# Patient Record
Sex: Male | Born: 1935 | Race: White | Hispanic: No | Marital: Married | Smoking: Never smoker
Health system: Southern US, Community
[De-identification: ages and names within clinical notes are randomized; demographics above are authoritative.]

## PROBLEM LIST (undated history)

## (undated) DIAGNOSIS — C189 Malignant neoplasm of colon, unspecified: Secondary | ICD-10-CM

## (undated) DIAGNOSIS — I10 Essential (primary) hypertension: Secondary | ICD-10-CM

## (undated) DIAGNOSIS — R197 Diarrhea, unspecified: Secondary | ICD-10-CM

## (undated) DIAGNOSIS — I2510 Atherosclerotic heart disease of native coronary artery without angina pectoris: Secondary | ICD-10-CM

## (undated) DIAGNOSIS — K219 Gastro-esophageal reflux disease without esophagitis: Secondary | ICD-10-CM

## (undated) DIAGNOSIS — S90414A Abrasion, right lesser toe(s), initial encounter: Secondary | ICD-10-CM

## (undated) DIAGNOSIS — S80811A Abrasion, right lower leg, initial encounter: Secondary | ICD-10-CM

## (undated) DIAGNOSIS — K589 Irritable bowel syndrome without diarrhea: Secondary | ICD-10-CM

## (undated) DIAGNOSIS — I639 Cerebral infarction, unspecified: Secondary | ICD-10-CM

## (undated) DIAGNOSIS — S90411A Abrasion, right great toe, initial encounter: Secondary | ICD-10-CM

## (undated) DIAGNOSIS — E039 Hypothyroidism, unspecified: Secondary | ICD-10-CM

## (undated) DIAGNOSIS — C20 Malignant neoplasm of rectum: Secondary | ICD-10-CM

## (undated) HISTORY — PX: KNEE SURGERY: SHX244

## (undated) HISTORY — DX: Malignant neoplasm of rectum: C20

## (undated) HISTORY — DX: Gastro-esophageal reflux disease without esophagitis: K21.9

## (undated) HISTORY — DX: Cerebral infarction, unspecified: I63.9

## (undated) HISTORY — DX: Atherosclerotic heart disease of native coronary artery without angina pectoris: I25.10

## (undated) HISTORY — DX: Essential (primary) hypertension: I10

---

## 2012-10-11 ENCOUNTER — Ambulatory Visit: Payer: Self-pay | Admitting: Physician Assistant

## 2012-10-15 ENCOUNTER — Ambulatory Visit (INDEPENDENT_AMBULATORY_CARE_PROVIDER_SITE_OTHER): Payer: Medicare Other | Admitting: Physician Assistant

## 2012-10-15 ENCOUNTER — Encounter: Payer: Self-pay | Admitting: Physician Assistant

## 2012-10-15 ENCOUNTER — Other Ambulatory Visit (INDEPENDENT_AMBULATORY_CARE_PROVIDER_SITE_OTHER): Payer: Medicare Other

## 2012-10-15 ENCOUNTER — Other Ambulatory Visit: Payer: Self-pay | Admitting: *Deleted

## 2012-10-15 VITALS — BP 110/60 | HR 76 | Ht 73.0 in | Wt 198.0 lb

## 2012-10-15 DIAGNOSIS — E785 Hyperlipidemia, unspecified: Secondary | ICD-10-CM | POA: Insufficient documentation

## 2012-10-15 DIAGNOSIS — I10 Essential (primary) hypertension: Secondary | ICD-10-CM | POA: Insufficient documentation

## 2012-10-15 DIAGNOSIS — R197 Diarrhea, unspecified: Secondary | ICD-10-CM

## 2012-10-15 DIAGNOSIS — R634 Abnormal weight loss: Secondary | ICD-10-CM

## 2012-10-15 DIAGNOSIS — I519 Heart disease, unspecified: Secondary | ICD-10-CM | POA: Insufficient documentation

## 2012-10-15 LAB — CBC
HCT: 39.9 % (ref 39.0–52.0)
Hemoglobin: 13.3 g/dL (ref 13.0–17.0)
MCV: 84.9 fl (ref 78.0–100.0)
RBC: 4.7 Mil/uL (ref 4.22–5.81)
RDW: 14.6 % (ref 11.5–14.6)
WBC: 8.8 10*3/uL (ref 4.5–10.5)

## 2012-10-15 LAB — C-REACTIVE PROTEIN: CRP: 0.6 mg/dL (ref 0.5–20.0)

## 2012-10-15 MED ORDER — MOVIPREP 100 G PO SOLR: 1.0000 | Freq: Once | ORAL | Status: DC

## 2012-10-15 NOTE — Progress Notes (Signed)
Subjective:    Patient ID: Alexander Johnston, male    DOB: 11-Feb-1925, 79 y.o.   MRN: [id number]  HPI [name] is a pleasant 79 year old white male, and new to GI today, self-referred for evaluation of diarrhea. Patient states that he has history of hypertension and coronary artery disease that no prior MI. He also has hyperlipidemia. He states that he has had problems with diarrhea over the past 4 years. He had undergone an evaluation through digestive health in [place] with Dr. Jobie Quaker within the past year and states that he was given a medication that he took for about 12 weeks that he says initially seen to help quite a bit but then lost its effectiveness. Neither he nor her's daughter remember the name of this medication. He has had some lab work done as well but did not have colonoscopy He was to be scheduled for colonoscopy however patient felt that the prep was excessively long over 4 days and declined to go through with the procedure. Patient has not had any prior colonoscopy, family history is negative for GI diseases far as he is aware. He says he is been taking probiotic so the past couple of months and seems to think they are helping. He says he has on average 8-10 bowel movements per day of loose to liquid stool. These episodes are associated with urgency that is worse postprandially. He also has frequent nocturnal episodes of diarrhea. He has in the past had incontinence . He has lost 50 pounds gradually over the past 2 years and says that his appetite is good but he finds he has less diarrhea PEs last. No nausea or vomiting no fever or chills no abdominal cramping or pain. He does admit to excessive gas. He very occasionally will have a normal bowel movement. He has had periods of time where he has seen mucus and small amounts of blood. We .do not have records from his GI evaluation or his primary care provider at the time of this office visit   Review of Systems  Constitutional: Positive for  unexpected weight change.  HENT: Negative.   Eyes: Negative.   Respiratory: Negative.   Cardiovascular: Negative.   Gastrointestinal: Positive for diarrhea.  Endocrine: Negative.   Genitourinary: Negative.   Musculoskeletal: Negative.   Skin: Negative.   Allergic/Immunologic: Negative.   Neurological: Negative.   Hematological: Negative.   Psychiatric/Behavioral: Negative.    Outpatient Encounter Prescriptions as of 10/15/2012  Medication Sig Dispense Refill  . amLODipine (NORVASC) 10 MG tablet Take 10 mg by mouth daily.      Marland Kitchen diltiazem (CARDIZEM CD) 240 MG 24 hr capsule Take 240 mg by mouth daily.      . hydrochlorothiazide (HYDRODIURIL) 25 MG tablet Take 25 mg by mouth daily.      . Levothyroxine Sodium (SYNTHROID PO) Take 1 tablet by mouth daily.      Marland Kitchen losartan (COZAAR) 100 MG tablet Take 100 mg by mouth daily.      . Rosuvastatin Calcium (CRESTOR PO) Take 1 tablet by mouth daily.      . sertraline (ZOLOFT) 25 MG tablet Take 25 mg by mouth daily.       No facility-administered encounter medications on file as of 10/15/2012.   No Known Allergies Patient Active Problem List   Diagnosis Date Noted  . HTN (hypertension) 10/15/2012  . Other and unspecified hyperlipidemia 10/15/2012  . Heart disease, unspecified 10/15/2012   History  Substance Use Topics  . Smoking status: Never  Smoker   . Smokeless tobacco: Never Used  . Alcohol Use: No   family history includes Diabetes in his brother; Heart disease in his mother; Hypertension in his brother.      Objective:   Physical Exam well-developed elderly white male in no acute distress, accompanied by his daughter blood pressure 110/60 pulse 76 height 6 foot 1 weight 198. HEENT; nontraumatic normocephalic EOMI PERRLA sclera anicteric, Supple; no JVD, Cardiovascular; regular rate and rhythm with S1-S2, Pulmonary clear bilaterally, Abdomen; soft, bowel sounds are somewhat hyperactive there is no palpable mass or hepatosplenomegaly he  is nontender, Rectal ;exam not done patient and daughter reports stool is documented Hemoccult positive, Extremities; 1+ edema in the ankles, Psych; mood and affect normal and appropriate he is somewhat hard of hearing        Assessment & Plan:  #49  79 year old male with 4 year history of persistent diarrhea with urgency and occasional incontinence and and associated 50 pound weight loss. Will need to rule out an underlying colitis or microscopic colitis or other malabsorptive process appear #2 hypertension #3 hyperlipidemia #4 coronary disease  Plan; will obtain records from his previous GI evaluation in his primary care provider Continue probiotics on a daily basis Check CBC and CRP today Schedule for colonoscopy with Dr. Arlyce Dice with random biopsies of the colon. Procedure was discussed in detail with the patient and his.her and they are agreeable to proceed He will continue to use Imodium when necessary

## 2012-10-15 NOTE — Patient Instructions (Addendum)
Please go to the basement level to have your labs drawn.  You have been scheduled for a colonoscopy with propofol. Please follow written instructions given to you at your visit today.  Please pick up your prep kit at the pharmacy within the next 1-3 days. If you use inhalers (even only as needed), please bring them with you on the day of your procedure.

## 2012-10-16 NOTE — Progress Notes (Signed)
Reviewed and agree with management.  Would also check a CMET and hemeoccults Marionna Gonia D. Arlyce Dice, M.D., [facility]

## 2012-10-17 ENCOUNTER — Ambulatory Visit (AMBULATORY_SURGERY_CENTER): Payer: Medicare Other | Admitting: Gastroenterology

## 2012-10-17 ENCOUNTER — Other Ambulatory Visit: Payer: Self-pay | Admitting: Gastroenterology

## 2012-10-17 ENCOUNTER — Other Ambulatory Visit (INDEPENDENT_AMBULATORY_CARE_PROVIDER_SITE_OTHER): Payer: Medicare Other

## 2012-10-17 ENCOUNTER — Telehealth: Payer: Self-pay

## 2012-10-17 ENCOUNTER — Encounter: Payer: Self-pay | Admitting: Gastroenterology

## 2012-10-17 VITALS — BP 112/71 | HR 67 | Temp 97.4°F | Resp 10 | Ht 73.0 in | Wt 198.0 lb

## 2012-10-17 DIAGNOSIS — L814 Other melanin hyperpigmentation: Secondary | ICD-10-CM

## 2012-10-17 DIAGNOSIS — C19 Malignant neoplasm of rectosigmoid junction: Secondary | ICD-10-CM

## 2012-10-17 DIAGNOSIS — D6859 Other primary thrombophilia: Secondary | ICD-10-CM

## 2012-10-17 DIAGNOSIS — D126 Benign neoplasm of colon, unspecified: Secondary | ICD-10-CM

## 2012-10-17 DIAGNOSIS — R197 Diarrhea, unspecified: Secondary | ICD-10-CM

## 2012-10-17 DIAGNOSIS — K6289 Other specified diseases of anus and rectum: Secondary | ICD-10-CM

## 2012-10-17 DIAGNOSIS — C189 Malignant neoplasm of colon, unspecified: Secondary | ICD-10-CM

## 2012-10-17 DIAGNOSIS — K5289 Other specified noninfective gastroenteritis and colitis: Secondary | ICD-10-CM

## 2012-10-17 DIAGNOSIS — C187 Malignant neoplasm of sigmoid colon: Secondary | ICD-10-CM

## 2012-10-17 DIAGNOSIS — K5730 Diverticulosis of large intestine without perforation or abscess without bleeding: Secondary | ICD-10-CM

## 2012-10-17 HISTORY — PX: COLONOSCOPY W/ BIOPSIES: SHX1374

## 2012-10-17 HISTORY — DX: Malignant neoplasm of colon, unspecified: C18.9

## 2012-10-17 LAB — COMPREHENSIVE METABOLIC PANEL
CO2: 28 mEq/L (ref 19–32)
Calcium: 8.4 mg/dL (ref 8.4–10.5)
Creatinine, Ser: 0.7 mg/dL (ref 0.4–1.5)
GFR: 115.59 mL/min (ref >60.00)
Glucose, Bld: 91 mg/dL (ref 70–99)
Total Bilirubin: 0.8 mg/dL (ref 0.3–1.2)
Total Protein: 6 g/dL (ref 6.0–8.3)

## 2012-10-17 LAB — PROTIME-INR: INR: 1.2 ratio — ABNORMAL HIGH (ref 0.8–1.0)

## 2012-10-17 MED ORDER — SODIUM CHLORIDE 0.9 % IV SOLN: 500.0000 mL | INTRAVENOUS | Status: DC

## 2012-10-17 NOTE — Progress Notes (Signed)
Patient did not experience any of the following events: a burn prior to discharge; a fall within the facility; wrong site/side/patient/procedure/implant event; or a hospital transfer or hospital admission upon discharge from the facility. (G8907) Patient did not have preoperative order for IV antibiotic SSI prophylaxis. (G8918)  

## 2012-10-17 NOTE — Patient Instructions (Signed)
YOU HAD AN ENDOSCOPIC PROCEDURE TODAY AT THE [facility]: Refer to the procedure report that was given to you for any specific questions about what was found during the examination.  If the procedure report does not answer your questions, please call your gastroenterologist to clarify.  If you requested that your care partner not be given the details of your procedure findings, then the procedure report has been included in a sealed envelope for you to review at your convenience later.  YOU SHOULD EXPECT: Some feelings of bloating in the abdomen. Passage of more gas than usual.  Walking can help get rid of the air that was put into your GI tract during the procedure and reduce the bloating. If you had a lower endoscopy (such as a colonoscopy or flexible sigmoidoscopy) you may notice spotting of blood in your stool or on the toilet paper. If you underwent a bowel prep for your procedure, then you may not have a normal bowel movement for a few days.  DIET: Your first meal following the procedure should be a light meal and then it is ok to progress to your normal diet.  A half-sandwich or bowl of soup is an example of a good first meal.  Heavy or fried foods are harder to digest and may make you feel nauseous or bloated.  Likewise meals heavy in dairy and vegetables can cause extra gas to form and this can also increase the bloating.  Drink plenty of fluids but you should avoid alcoholic beverages for 24 hours.  ACTIVITY: Your care partner should take you home directly after the procedure.  You should plan to take it easy, moving slowly for the rest of the day.  You can resume normal activity the day after the procedure however you should NOT DRIVE or use heavy machinery for 24 hours (because of the sedation medicines used during the test).    SYMPTOMS TO REPORT IMMEDIATELY: A gastroenterologist can be reached at any hour.  During normal business hours, 8:30 AM to 5:00 PM Monday through Friday,  call [phone].  After hours and on weekends, please call the GI answering service at [phone] who will take a message and have the physician on call contact you.   Following lower endoscopy (colonoscopy or flexible sigmoidoscopy):  Excessive amounts of blood in the stool  Significant tenderness or worsening of abdominal pains  Swelling of the abdomen that is new, acute  Fever of 100F or higher    FOLLOW UP: If any biopsies were taken you will be contacted by phone or by letter within the next 1-3 weeks.  Call your gastroenterologist if you have not heard about the biopsies in 3 weeks.  Our staff will call the home number listed on your records the next business day following your procedure to check on you and address any questions or concerns that you may have at that time regarding the information given to you following your procedure. This is a courtesy call and so if there is no answer at the home number and we have not heard from you through the emergency physician on call, we will assume that you have returned to your regular daily activities without incident.  SIGNATURES/CONFIDENTIALITY: You and/or your care partner have signed paperwork which will be entered into your electronic medical record.  These signatures attest to the fact that that the information above on your After Visit Summary has been reviewed and is understood.  Full responsibility of the confidentiality   of this discharge information lies with you and/or your care-partner.   You will be assisted to lab for labwork ordered by Dr Arlyce Dice . Today before you leave.  Dr. Marzetta Board office will set up a CT scan of abd pelvis & chest.  Contrast is given to you with an instruction sheet to fill out times to take once CT details done

## 2012-10-17 NOTE — Progress Notes (Signed)
Called to room to assist during endoscopic procedure.  Patient ID and intended procedure confirmed with present staff. Received instructions for my participation in the procedure from the performing physician. ewm 

## 2012-10-17 NOTE — Progress Notes (Signed)
Patient states that his wife has Alzheimers and knows him sometimes.  He's been very depressed, and he's not eating well.  His other Dr. Prentiss Bells him on Zoloft for depression.  The patient is very sad about his wife.   Patient does have a depends on due to the fact that there was stool all over his apartment according to his daughter.   The patient is a very proud man, and doesn't want anyone to help him.

## 2012-10-17 NOTE — Progress Notes (Signed)
Lidocaine-40mg IV prior to Propofol InductionPropofol given over incremental dosages 

## 2012-10-17 NOTE — Telephone Encounter (Signed)
Pt scheduled for CT of CAP at [facility] CT 10/21/12@2pm . Pt to be NPO after 10am, drink bottle 1 of contrast at 12noon and bottle 2 of contrast at 1pm. Spoke with Boyd Kerbs RN in [facility] she will notify pt of appt date and instructions. Pt to have labs before leaving today, orders in epic.

## 2012-10-17 NOTE — Op Note (Signed)
[facility] [address] [place], [address]   COLONOSCOPY PROCEDURE REPORT  PATIENT: Nichalas, Coin  MR#: [id number] BIRTHDATE: [birth year] , 87  yrs. old GENDER: Male ENDOSCOPIST: Louis Meckel, MD REFERRED QI:ONGEX Tripp, M.D. PROCEDURE DATE:  10/17/2012 PROCEDURE:   Colonoscopy with cold biopsy polypectomy, Colonoscopy with snare polypectomy, Colonoscopy with biopsy, and Submucosal injection, any substance ASA CLASS:   Class II INDICATIONS:chronic diarrhea. MEDICATIONS: MAC sedation, administered by CRNA and propofol (Diprivan) 300mg  IV  DESCRIPTION OF PROCEDURE:   After the risks benefits and alternatives of the procedure were thoroughly explained, informed consent was obtained.  A digital rectal exam revealed a palpable rectal mass. 3-4cm from the anal verge.  The [id number] endoscope was introduced through the anus and advanced to the cecum, which was identified by the ileocecal valve. No adverse events experienced.   Limited by poor preparation. There was a significant amount of retained solid and liquid stool  The quality of the prep was Suprep poor  The instrument was then slowly withdrawn as the colon was fully examined.      COLON FINDINGS: There was a circumferential mass beginning at approximately 3-4 cm from the anal verge and extending at least 10-12 cm proximally.  The lumen was narrowed but the 12 mm colonoscope could easily traverse the strictured area.  Because was biopsied in multiple biopsies were taken.  The proximal and distal ends of the mass were marked with submucosal injections of spot. In the distal descending colon there was a semi-productive at 2 cm polyp with markedly friable mucosa.  Biopsies were taken and the area was marked with submucosal injections of spot. There is diffuse melanosis coli. In the ascending colon there were 23 mm sessile polyps that were removed with cold polypectomy snare and submitted to  pathology. There was diffuse melanosis coli.  It was difficult to obtain a detailed examination of the mucosa because of poor prep.  There appeared to be some erythema throughout the colon and there was mild edema in the area of the sigmoid.  Biopsies were taken throughout the colon.   There was a circumferential mass beginning at approximately 3-4 cm from the anal verge and extending at least 10-12 cm proximally.  The lumen was narrowed but the 12 mm colonoscope could easily traverse the strictured area.  Because was biopsied in multiple biopsies were taken.  The proximal and distal ends of the mass were marked with submucosal injections of spot. In the distal descending colon there was a semi-productive at 2 cm polyp with markedly friable mucosa.  Biopsies were taken and the area was marked with submucosal injections of spot. .  Retroflexion was not performed due to a narrow rectal vault. The time to cecum=13 minutes 45 seconds.  Withdrawal time=26 minutes 35 seconds.  The scope was withdrawn and the procedure completed. COMPLICATIONS: There were no complications.  ENDOSCOPIC IMPRESSION: 1.   malignant neoplasm of the rectosigmoid 2.  large polyp distal descending colon-rule out synchronous lesion 3.  possible colitis 4.  benign polyps-ascending colon 5.  melanosis coli 6.  diverticulosis  RECOMMENDATIONS: 1.  await biopsy findings 2.  CT of the chest, abdomen and pelvis 3.  colonoscopy one year  eSigned:  Louis Meckel, MD 10/17/2012 3:21 PM   cc: Almond Lint, MD   PATIENT NAME:  Quasean, Frye MR#: [id number]

## 2012-10-17 NOTE — Progress Notes (Signed)
Per Dr. Arlyce Dice, pt is to have labwork today before discharge- CMET, CEA, and INR.  Selinda Michaels, RN aware and will put order into EPIC

## 2012-10-18 ENCOUNTER — Telehealth: Payer: Self-pay

## 2012-10-18 LAB — CEA: CEA: 17.3 ng/mL — ABNORMAL HIGH (ref 0.0–5.0)

## 2012-10-18 NOTE — Telephone Encounter (Signed)
  Follow up Call-  Call back number 10/17/2012  Post procedure Call Back phone  # [phone] Celeste  Permission to leave phone message Yes     Patient questions:  Do you have a fever, pain , or abdominal swelling? no Pain Score  0 *  Have you tolerated food without any problems? yes  Have you been able to return to your normal activities? yes  Do you have any questions about your discharge instructions: Diet   no Medications  no Follow up visit  no  Do you have questions or concerns about your Care? no  Actions: * If pain score is 4 or above: No action needed, pain <4.

## 2012-10-21 ENCOUNTER — Telehealth: Payer: Self-pay | Admitting: Gastroenterology

## 2012-10-21 ENCOUNTER — Ambulatory Visit (INDEPENDENT_AMBULATORY_CARE_PROVIDER_SITE_OTHER)
Admission: RE | Admit: 2012-10-21 | Discharge: 2012-10-21 | Disposition: A | Discharge status: Home / Self Care | Payer: Medicare Other | Source: Ambulatory Visit | Attending: Gastroenterology | Admitting: Gastroenterology

## 2012-10-21 ENCOUNTER — Ambulatory Visit (INDEPENDENT_AMBULATORY_CARE_PROVIDER_SITE_OTHER): Payer: Medicare Other | Admitting: General Surgery

## 2012-10-21 ENCOUNTER — Encounter (INDEPENDENT_AMBULATORY_CARE_PROVIDER_SITE_OTHER): Payer: Self-pay | Admitting: General Surgery

## 2012-10-21 ENCOUNTER — Telehealth: Payer: Self-pay | Admitting: *Deleted

## 2012-10-21 VITALS — BP 102/58 | HR 68 | Temp 98.1°F | Resp 14 | Ht 74.0 in | Wt 200.6 lb

## 2012-10-21 DIAGNOSIS — C20 Malignant neoplasm of rectum: Secondary | ICD-10-CM

## 2012-10-21 DIAGNOSIS — C19 Malignant neoplasm of rectosigmoid junction: Secondary | ICD-10-CM

## 2012-10-21 DIAGNOSIS — R198 Other specified symptoms and signs involving the digestive system and abdomen: Secondary | ICD-10-CM

## 2012-10-21 DIAGNOSIS — K6289 Other specified diseases of anus and rectum: Secondary | ICD-10-CM

## 2012-10-21 MED ORDER — IOHEXOL 300 MG/ML  SOLN
100.0000 mL | Freq: Once | INTRAMUSCULAR | Status: AC | PRN
  Administered 2012-10-21: 100 mL via INTRAVENOUS

## 2012-10-21 NOTE — Assessment & Plan Note (Signed)
Pt has low rectal cancer starting at 3-4 cm and going 12 cm up.    Pt having Ct scan today.  Given size of tumor and length of duration of symptoms, pt is likely to have lymphatic disease and possibly metastatic disease.  If patient has metastatic disease, we would proceed with systemic treatment.  If ct negative for mets, but positive for lymphatic disease, we would proceed with chemoradiation.    If negative for both, I would obtain EUS to see if T3.    I suspect that if he has complete response to treatment, he would be a reasonable candidate to follow.  He has patulous sphincter and already has issues with incontinence.  I think he would do poorly with low coloanal anastamosis.  He has good functional status other than his bowel function, however, and would be candidate for APR.  We will discuss at conference and see the results of his scan.    30 min spent in counseling.

## 2012-10-21 NOTE — Progress Notes (Signed)
Chief Complaint  Patient presents with  . New Evaluation    eval rectal cancer    HISTORY: Pt is a 79 yo M who presents with diarrhea and blood in stool.  He has had diarrhea for around 4 years.  He started having more incontinence recently.  He keeps paper towels in his underwear because of soilage.  He denies anal pain.  He denies frank blood in toilet, but the soilage he has is usually pink.  He has had around a 50 pound weight loss, but has deliberately tried to lose weight.  His daughter says he seems to avoiding eating altogether because it makes him have more diarrhea.  He has not had any prior colonoscopies before this recent one that Dr. Arlyce Dice performed.  He was found to have circumferential mass 3-4 cm from anal verge extending 10-12 cm proximally.  The proximal and distal extent of tumor was tattooed.  The mass was partially obstruction, but 12 mm scope was able to pass.  Retroflexion could not be performed due to non compliant rectum.    Past Medical History  Diagnosis Date  . CAD (coronary artery disease)   . Hypertension   . Stroke   . GERD (gastroesophageal reflux disease)     Past Surgical History  Procedure Laterality Date  . Knee surgery      bilateral    Current Outpatient Prescriptions  Medication Sig Dispense Refill  . amLODipine (NORVASC) 10 MG tablet Take 10 mg by mouth daily.      Marland Kitchen diltiazem (CARDIZEM CD) 240 MG 24 hr capsule Take 240 mg by mouth daily.      . hydrochlorothiazide (HYDRODIURIL) 25 MG tablet Take 25 mg by mouth daily.      . Levothyroxine Sodium (SYNTHROID PO) Take 1 tablet by mouth daily.      Marland Kitchen losartan (COZAAR) 100 MG tablet Take 100 mg by mouth daily.      . Rosuvastatin Calcium (CRESTOR PO) Take 1 tablet by mouth daily.      . sertraline (ZOLOFT) 25 MG tablet Take 25 mg by mouth daily.       No current facility-administered medications for this visit.     No Known Allergies   Family History  Problem Relation Age of Onset  . Heart  disease Mother     "all her family"  . Diabetes Brother   . Hypertension Brother   . Cancer Daughter     choriocarricinoma     History   Social History  . Marital Status: Married    Spouse Name: N/A    Number of Children: 5  . Years of Education: N/A   Social History Main Topics  . Smoking status: Never Smoker   . Smokeless tobacco: Never Used  . Alcohol Use: No  . Drug Use: No  . Sexual Activity: None    REVIEW OF SYSTEMS - PERTINENT POSITIVES ONLY: 12 point review of systems negative other than HPI and PMH except for weight loss, diarrhea, weakness, rectal bleeding.    EXAM: Filed Vitals:   10/21/12 0907  BP: 102/58  Pulse: 68  Temp: 98.1 F (36.7 C)  Resp: 14   Filed Weights   10/21/12 0907  Weight: 200 lb 9.6 oz (90.992 kg)     Gen:  No acute distress.  Well nourished and well groomed.   Neurological: Alert and oriented to person, place, and time. Coordination normal. Hard of hearing.   Head: Normocephalic and atraumatic.  Eyes: Conjunctivae  are normal. Pupils are equal, round, and reactive to light. No scleral icterus.  Neck: Normal range of motion. Neck supple. No tracheal deviation or thyromegaly present.  Cardiovascular: Normal rate, regular rhythm, normal heart sounds and intact distal pulses.  Exam reveals no gallop and no friction rub.  No murmur heard. Respiratory: Effort normal.  No respiratory distress. No chest wall tenderness. Breath sounds normal.  No wheezes, rales or rhonchi.  GI: Soft. Bowel sounds are normal. The abdomen is soft and nontender.  Non distended.  There is no rebound and no guarding.  Rectal exam:  Poor sphincter tone.  External hemorrhoids.  Mass palpable at tip of finger around 4 cm.  ? Fixed.   Musculoskeletal: Normal range of motion. Extremities are nontender.  Lymphadenopathy: No cervical, preauricular, postauricular or axillary adenopathy is present Skin: Skin is warm and dry. No rash noted. No diaphoresis. No erythema. No  pallor. No clubbing, cyanosis, or edema.   Psychiatric: Normal mood and affect. Behavior is normal. Judgment and thought content normal.    LABORATORY RESULTS: Available labs are reviewed  Pathology Diagnosis 1. Colon, biopsy, ascending - TUBULOVILLOUS ADENOMA. - HIGH GRADE DYSPLASIA IS NOT IDENTIFIED. 2. Colon, biopsy, random - MELANOSIS COLI. - THERE IS NO EVIDENCE OF IDIOPATHIC INFLAMMATORY BOWEL DISEASE, DYSPLASIA, OR MALIGNANCY. 3. Rectosigmoid , rectosigmoid mass - ADENOCARCINOMA. - SEE COMMENT. 4. Colon, biopsy, distal descending - TUBULOVILLOUS ADENOMA. - HIGH GRADE DYSPLASIA IS NOT IDENTIFIED.  CEA 17.3, K 3.0.  INR 1.2, otherwise, essentially normal labs.    RADIOLOGY RESULTS: See E-Chart or I-Site for most recent results.  Images and reports are reviewed. Scans pending.     ASSESSMENT AND PLAN: Rectal cancer Pt has low rectal cancer starting at 3-4 cm and going 12 cm up.    Pt having Ct scan today.  Given size of tumor and length of duration of symptoms, pt is likely to have lymphatic disease and possibly metastatic disease.  If patient has metastatic disease, we would proceed with systemic treatment.  If ct negative for mets, but positive for lymphatic disease, we would proceed with chemoradiation.    If negative for both, I would obtain EUS to see if T3.    I suspect that if he has complete response to treatment, he would be a reasonable candidate to follow.  He has patulous sphincter and already has issues with incontinence.  I think he would do poorly with low coloanal anastamosis.  He has good functional status other than his bowel function, however, and would be candidate for APR.  We will discuss at conference and see the results of his scan.    30 min spent in counseling.        Maudry Diego MD Surgical Oncology, General and Endocrine Surgery [facility]      Visit Diagnoses: 1. Rectal cancer     Primary Care  Physician: Florentina Jenny, MD  Barnet Pall MD GI

## 2012-10-21 NOTE — Patient Instructions (Signed)
Once we get the Ct scan result, this will determine which direction to go in.  We may need to get another test, an endoscopic ultrasound, in order to determine treatment.    You have an appt next week with Dr. Truett Perna.    If additional lesions are seen on the CT scan, you might not need that other test.  Many times patients with this type of tumor end up getting chemoradiation (chemo pill and radiation) prior to surgery.  We do not yet have enough information to make this decision.

## 2012-10-21 NOTE — Telephone Encounter (Signed)
Pt had CT at 2pm today. Discussed with pts daughter that the results were not back yet.

## 2012-10-21 NOTE — Telephone Encounter (Signed)
Spoke with patient's daughter by phone and confirmed appointment with Dr. Truett Perna for 10/31/12.  All questions were answered.  Contact names and numbers were provided.

## 2012-10-22 ENCOUNTER — Telehealth: Payer: Self-pay | Admitting: Gastroenterology

## 2012-10-22 NOTE — Telephone Encounter (Signed)
Left message for pts daughter to call back.  Spoke with pts daughter and she is aware. 

## 2012-10-22 NOTE — Telephone Encounter (Signed)
Pts daughter calling for CT results. Please advise.

## 2012-10-22 NOTE — Telephone Encounter (Signed)
Inform the patient that the CT scan looks very good.  Disease is confined to the colon.  There is no evidence for metastatic disease.

## 2012-10-31 ENCOUNTER — Telehealth: Payer: Self-pay

## 2012-10-31 ENCOUNTER — Ambulatory Visit: Payer: Medicare Other

## 2012-10-31 ENCOUNTER — Other Ambulatory Visit: Payer: Self-pay

## 2012-10-31 ENCOUNTER — Telehealth: Payer: Self-pay | Admitting: Oncology

## 2012-10-31 ENCOUNTER — Ambulatory Visit (HOSPITAL_BASED_OUTPATIENT_CLINIC_OR_DEPARTMENT_OTHER): Payer: Medicare Other | Admitting: Oncology

## 2012-10-31 ENCOUNTER — Other Ambulatory Visit: Payer: Self-pay | Admitting: *Deleted

## 2012-10-31 VITALS — BP 134/70 | HR 70 | Temp 98.6°F | Resp 20 | Ht 74.0 in | Wt 193.1 lb

## 2012-10-31 DIAGNOSIS — C20 Malignant neoplasm of rectum: Secondary | ICD-10-CM

## 2012-10-31 NOTE — Progress Notes (Signed)
[facility] New Patient Consult   Referring MD: Rob Kaplan   Alexander Johnston 79 y.o.  07/16/1924    Reason for Referral: Rectal cancer     HPI: He was referred to Dr. Kaplan for evaluation of "diarrhea ". He reports intermittent diarrhea for the past 4 years. He describes frequent small volume stools that include mucous and blood. He has rectal urgency.  He was taken to a colonoscopy on 10/17/2012. Rectal exam confirmed a mass at 3-4 cm from the anal verge. The preparation was poor. A circumferential mass was noted beginning at 3-4 cm from the anal verge and extending at least 10-12 cm proximally. Multiple biopsies were obtained. The mass was tattooed. A 2 cm polyp was noted in the descending colon. The polyp was biopsied and tattooed. Diffuse melanosis coli. Sessile polyps were removed from the a sending colon and submitted to pathology. Erythema was noted throughout the colon with mild edema in the sigmoid.  The pathology ([id number]) revealed a tubulovillous adenoma of the ascending colon, random colon biopsy with melanosis coli and no evidence of inflammatory bowel disease or malignancy. The rectosigmoid mass biopsy revealed adenocarcinoma. The descending colon biopsy revealed a tubulovillous adenoma without high-grade dysplasia.  CTs of the chest, abdomen, and pelvis on 10/21/2012 revealed no evidence of metastatic disease in the chest. No liver masses to diffuse rectal wall thickening was noted. Mild soft tissue stranding in the perirectal fat with no pathologically enlarged lymph nodes. No pelvic or abdominal lymphadenopathy. No other soft tissue masses. Large colonic stool burden and extensive diverticulosis.  He was referred to Dr. Byerly. She recommends an EUS to define the clinical stage prior to deciding on surgery versus neoadjuvant therapy.    Past Medical History  Diagnosis Date  . CAD (coronary artery disease)   . Hypertension   .  TIA   approximately  2 years ago   . GERD (gastroesophageal reflux disease)     .   Depression                                                                                              started an antidepressant approximately 2 weeks ago  Past Surgical History  Procedure Laterality Date  . Knee surgery      bilateral knee replacement    .   Removal of a "palate "tumor                                                                                                     50 years ago  Family History  Problem Relation Age of Onset  . Heart disease Mother     "all her family"  . Diabetes Brother   .   Hypertension Brother   . Cancer Daughter      gestational trophoblastic disease   No other family history of cancer  Current outpatient prescriptions:amLODipine (NORVASC) 10 MG tablet, Take 10 mg by mouth daily., Disp: , Rfl: ;  diltiazem (CARDIZEM CD) 240 MG 24 hr capsule, Take 240 mg by mouth daily., Disp: , Rfl: ;  hydrochlorothiazide (HYDRODIURIL) 25 MG tablet, Take 25 mg by mouth daily., Disp: , Rfl: ;  Levothyroxine Sodium (SYNTHROID PO), Take 1 tablet by mouth daily., Disp: , Rfl:  losartan (COZAAR) 100 MG tablet, Take 100 mg by mouth daily., Disp: , Rfl: ;  Rosuvastatin Calcium (CRESTOR PO), Take 1 tablet by mouth daily., Disp: , Rfl: ;  sertraline (ZOLOFT) 25 MG tablet, Take 25 mg by mouth daily., Disp: , Rfl:   Allergies: No Known Allergies  Social History:   He is a retired minister and linguist. He does not use tobacco or alcohol. No transfusion history. No risk factor for HIV or hepatitis.  ROS:   Positives include: 60 pound weight loss over the past 2 years-she relates this to not eating in an attempt to control the "diarrhea ", intermittent leakage of stool-wearing a diaper or brief, intermittent rectal bleeding for several years, poison ivy/poison sumac rash recently  A complete ROS was otherwise negative.  Physical Exam:  Blood pressure 134/70, pulse 70, temperature 98.6 F (37 C),  temperature source Oral, resp. rate 20, height 6' 2" (1.88 m), weight 193 lb 1.6 oz (87.59 kg).  HEENT: Oropharynx without visible mass, neck without mass Lungs: Lungs clear bilaterally Cardiac: Regular rate and rhythm, distant heart sounds Abdomen: No hepatomegaly, nontender, no mass, no splenomegaly,? Reducible left inguinal hernia GU: Testes without mass  Vascular: 1+ pitting edema at the left lower leg/ankle, trace pitting edema at the right lower leg/ankle Lymph nodes: No cervical, supraclavicular, axillary, or inguinal nodes Neurologic: Alert and oriented, the motor exam appears intact in the upper and lower extremities Skin: Sebaceous cyst at the left upper back   LAB:  CBC  Lab Results  Component Value Date   WBC 8.8 10/15/2012   HGB 13.3 10/15/2012   HCT 39.9 10/15/2012   MCV 84.9 10/15/2012   PLT 375.0 10/15/2012     CMP      Component Value Date/Time   NA 136 10/17/2012 1630   K 3.0* 10/17/2012 1630   CL 101 10/17/2012 1630   CO2 28 10/17/2012 1630   GLUCOSE 91 10/17/2012 1630   BUN 8 10/17/2012 1630   CREATININE 0.7 10/17/2012 1630   CALCIUM 8.4 10/17/2012 1630   PROT 6.0 10/17/2012 1630   ALBUMIN 3.0* 10/17/2012 1630   AST 21 10/17/2012 1630   ALT 16 10/17/2012 1630   ALKPHOS 68 10/17/2012 1630   BILITOT 0.8 10/17/2012 1630   10/17/2012-CEA 17.3  Radiology: As per history of present illness    Assessment/Plan:   1. Adenocarcinoma of the distal rectum, staging EUS pending, CTs of the chest, abdomen, and pelvis without evidence of metastatic disease  2. Elevated CEA  3. "Diarrhea "/rectal urgency  4. Weight loss  5. History of coronary artery disease  6. Depression  7. Multiple colon polyps on the colonoscopy 10/17/2012   Disposition:   Mr. Lauritsen has been diagnosed with rectal cancer. I discussed the diagnosis and treatment options with Mr. Mota and his daughter. He will be referred for a rectal EUS to define the clinical stage. We will then decide on the indication  for chemotherapy/radiation versus   surgery.  He appears to have a good performance status despite his age and the reported weight loss. He appears to be a candidate for Xeloda/radiation. If he is not a surgical candidate he can be treated with chemotherapy/radiation as primary therapy and then observed.  His case will be presented at the GI tumor conference after the endoscopic ultrasound.  Mr. Cerreta indicated he is depressed and question the role of treatment. He agrees to the endoscopic ultrasound and a followup visit. He will also be referred to Dr. Moody.  Approximately 60 minutes were spent with the patient today. The majority of the time was used for counseling/coordination of care  Tawanda Schall 10/31/2012, 5:39 PM    

## 2012-10-31 NOTE — Telephone Encounter (Signed)
Message copied by Donata Duff on Thu Oct 31, 2012  3:23 PM ------      Message from: Rob Bunting P      Created: Thu Oct 31, 2012  3:03 PM       Almira Coaster,      I can do it next week            Kevron Patella,      Can you help set this up?  Lower EUS, radial scope only, 45 minutes, dx rectal cancer staging, next Thursday, does not need MAC.  Thanks            DJ                  ----- Message -----         From: Cira Rue, RN         Sent: 10/31/2012   2:56 PM           To: Rachael Fee, MD, Donata Duff, CMA, #            Hi,            Dr. Truett Perna is requesting that EUS be performed on this rectal cancer patient.  Is there any way you can do it next week?      Dr. Truett Perna is seeing the patient now and feels this is the next step.      The patient's daughter, Nadara Mode, is with him and a point of contact.            Thank you,      Vicie Mutters       ------

## 2012-10-31 NOTE — Progress Notes (Signed)
Met with Alexander Johnston and family. Explained role of nurse navigator. Educational information provided on rectal cancer. Referral made to dietician for diet education. [facility] resource sheet provided to patient, including SW service information.  Contact names and phone numbers were provided.  No barriers to care identified at present time.  Will continue to follow as needed.

## 2012-10-31 NOTE — Telephone Encounter (Signed)
Pt has been scheduled for 11/07/12 1030 am Pt has been instructed and meds reviewed

## 2012-10-31 NOTE — Telephone Encounter (Signed)
Message copied by Donata Duff on Thu Oct 31, 2012  3:02 PM ------      Message from: Vicie Mutters B      Created: Thu Oct 31, 2012  2:56 PM       Hi,            Dr. Truett Perna is requesting that EUS be performed on this rectal cancer patient.  Is there any way you can do it next week?      Dr. Truett Perna is seeing the patient now and feels this is the next step.      The patient's daughter, Nadara Mode, is with him and a point of contact.            Thank you,      Vicie Mutters ------

## 2012-10-31 NOTE — Telephone Encounter (Signed)
Gave the pt the oct 2014 appt. The pt's family wants to wait to schedule the nutrition appt

## 2012-11-01 ENCOUNTER — Telehealth: Payer: Self-pay | Admitting: Oncology

## 2012-11-01 ENCOUNTER — Telehealth: Payer: Self-pay | Admitting: *Deleted

## 2012-11-01 DIAGNOSIS — C20 Malignant neoplasm of rectum: Secondary | ICD-10-CM

## 2012-11-01 MED ORDER — POTASSIUM CHLORIDE CRYS ER 20 MEQ PO TBCR: 20.0000 meq | EXTENDED_RELEASE_TABLET | Freq: Every day | ORAL | Status: DC

## 2012-11-01 NOTE — Telephone Encounter (Signed)
Talked pt gave him appt for lab on 11/06/12 per POF

## 2012-11-01 NOTE — Telephone Encounter (Signed)
Spoke with pt's daughter. She reports pt is not taking any potassium supplements. Per Dr. Truett Perna: Potassium was low on 9/4. Rx sent to pharmacy. Begin KCL 20 meq daily. Instructed her lab appt will be added to 10/2 visit. Arrive for 0915 lab. She voiced understanding.

## 2012-11-04 ENCOUNTER — Ambulatory Visit
Admission: RE | Admit: 2012-11-04 | Discharge: 2012-11-04 | Disposition: A | Discharge status: Home / Self Care | Payer: Medicare Other | Source: Ambulatory Visit | Attending: Radiation Oncology | Admitting: Radiation Oncology

## 2012-11-04 ENCOUNTER — Encounter: Payer: Self-pay | Admitting: Radiation Oncology

## 2012-11-04 VITALS — BP 108/80 | HR 84 | Temp 98.5°F | Resp 20 | Ht 74.0 in | Wt 196.4 lb

## 2012-11-04 DIAGNOSIS — K589 Irritable bowel syndrome without diarrhea: Secondary | ICD-10-CM | POA: Insufficient documentation

## 2012-11-04 DIAGNOSIS — I10 Essential (primary) hypertension: Secondary | ICD-10-CM | POA: Insufficient documentation

## 2012-11-04 DIAGNOSIS — Z79899 Other long term (current) drug therapy: Secondary | ICD-10-CM | POA: Insufficient documentation

## 2012-11-04 DIAGNOSIS — I2510 Atherosclerotic heart disease of native coronary artery without angina pectoris: Secondary | ICD-10-CM | POA: Insufficient documentation

## 2012-11-04 DIAGNOSIS — K219 Gastro-esophageal reflux disease without esophagitis: Secondary | ICD-10-CM | POA: Insufficient documentation

## 2012-11-04 DIAGNOSIS — C189 Malignant neoplasm of colon, unspecified: Secondary | ICD-10-CM

## 2012-11-04 DIAGNOSIS — R197 Diarrhea, unspecified: Secondary | ICD-10-CM | POA: Insufficient documentation

## 2012-11-04 DIAGNOSIS — Z8673 Personal history of transient ischemic attack (TIA), and cerebral infarction without residual deficits: Secondary | ICD-10-CM | POA: Insufficient documentation

## 2012-11-04 DIAGNOSIS — C20 Malignant neoplasm of rectum: Secondary | ICD-10-CM

## 2012-11-04 HISTORY — DX: Diarrhea, unspecified: R19.7

## 2012-11-04 HISTORY — DX: Malignant neoplasm of colon, unspecified: C18.9

## 2012-11-04 HISTORY — DX: Irritable bowel syndrome, unspecified: K58.9

## 2012-11-04 NOTE — Progress Notes (Addendum)
Please see the Nurse Progress Note in the MD Initial Consult Encounter for this patient. 

## 2012-11-04 NOTE — Progress Notes (Signed)
GU Location of Tumor / Histology: Colon/rectal    Patient presented 36-48  Intermittent  months ago with signs/symptoms of: 3-4 years diarrhea,blood/mucous/ rectal urgency   Biopsies of(if applicable) revealed: 10/17/12:1. Colon, biopsy, ascending- TUBULOVILLOUS ADENOMA.- HIGH GRADE DYSPLASIA IS NOT IDENTIFIED.2. Colon, biopsy, random MELANOSIS COLI. - THERE IS NO EVIDENCE OF IDIOPATHIC INFLAMMATORY BOWEL DISEASE, DYSPLASIA, ORMALIGNANCY.3. Rectosigmoid , rectosigmoid mass- ADENOCARCINOMA.- SEE COMMENT.4. Colon, biopsy, distal descending- TUBULOVILLOUS ADENOMA.- HIGH GRADE DYSPLASIA IS NOT IDENTIFIED.  Past/Anticipated interventions by urology, if any:no   Past/Anticipated interventions by medical oncology, if ZOX:WRUE 11/14/12 Lonna Cobb  Weight changes, if any: 60 lb wt.loss past 2 years  Bowel/Bladder complaints, if AVW:UJWJXBJY/NWGNF/AOZHYQ past 3-4 years,rectal urgency, diverticulosis,intermittent leakage stool, ,wears depends prn,left inguinal hernia Nausea/Vomiting, if any: no   Pain issues, if any: no  SAFETY ISSUES:   Prior radiation?*no  Pacemaker: no  Is the patient on methotrexate? no  Current Complaints / other details:  Married,  5 children, Optician, dispensing and Lingist, , hx TIA, CAD, HTN, hx removal palate tumor, b/l knee replacement, ,pitting edema +1 left lower ankle,trace right lower leg, ankle, elevated CEA, Depression Referred for a rectal EUS to define staging

## 2012-11-05 ENCOUNTER — Telehealth: Payer: Self-pay | Admitting: *Deleted

## 2012-11-05 ENCOUNTER — Telehealth: Payer: Self-pay | Admitting: Dietician

## 2012-11-05 ENCOUNTER — Other Ambulatory Visit: Payer: Self-pay | Admitting: *Deleted

## 2012-11-05 NOTE — Telephone Encounter (Signed)
Brief Outpatient Oncology Nutrition Note  Patient has been identified to be at risk on malnutrition screen.  Wt Readings from Last 10 Encounters:  11/04/12 196 lb 6.4 oz (89.086 kg)  10/31/12 193 lb 1.6 oz (87.59 kg)  10/21/12 200 lb 9.6 oz (90.992 kg)  10/17/12 198 lb (89.812 kg)  10/15/12 198 lb (89.812 kg)    Called due to weight loss.  60 lbs loss in the last 2 years related to not eating in the attempt to control diarrhea.    Patient with recently dx rectal cancer.  Plan is yet to be determined.  Patient is scheduled for an EGD in 2 days.  Patient was unavailable.  Spoke with patient's daughter who reported that patient's intake has been much improved since being with her for the past few days.  Diarrhea has increased however.  Patient typically eats a lot of fruits and vegetables secondary to the health benefits and is resistant to decrease their intake despite diarrhea.  Discussed a low fiber high calorie diet with daughter and the importance of increasing nutrition especially in light of any potential surgery.  They have been giving patient Nutritional Shakes mixed with ice cream.  Recommended patient come in for an appointment with the RD but daughter states that she is quite stressed with everything at this time and wishes to wait on this.  [facility] information for [facility] provided.  Oran Rein, RD, LDN

## 2012-11-05 NOTE — Telephone Encounter (Signed)
Left message on voicemail for pt's daughter to cancel 9/24 lab. Pt will have lab 10/2.

## 2012-11-06 ENCOUNTER — Other Ambulatory Visit: Payer: Self-pay

## 2012-11-06 ENCOUNTER — Other Ambulatory Visit: Payer: Medicare Other | Admitting: Lab

## 2012-11-06 DIAGNOSIS — C20 Malignant neoplasm of rectum: Secondary | ICD-10-CM

## 2012-11-06 NOTE — Progress Notes (Signed)
Radiation Oncology         [phone] ________________________________  Name: Alexander Johnston MRN: [id number]  Date: 11/04/2012  DOB: November 07, 1924  XB:JYNWG, Alexander Cooter, MD  Ladene Artist, MD   Almond Lint, MD  REFERRING PHYSICIAN: Ladene Artist, MD   DIAGNOSIS: Rectal cancer  HISTORY OF PRESENT ILLNESS::Alexander Johnston is a 79 y.o. male who is seen for an initial consultation visit. The patient describes a history of diarrhea which has been a chronic issue for him. He also describes some blood corrected of which increased and decreased over time. Overall he feels that he has had this to some degree for approximately 6 months. The patient denies any difficulties with constipation and denies any pelvic pain. He describes losing approximately 60 pounds over this time.Marland Kitchen He feels that his food intake decreased as one way to manage his diarrhea.  The patient was taken for a colonoscopy on 10/17/2012. A mass was seen within the rectum approximately 3-4 cm from the anal verge. This was a circumferential mass and extended to at least 10-12 cm proximally. Multiple biopsies were obtained and additional polyps were noted within the colon. Pathology returned positive for an adenocarcinoma from the rectal/rectosigmoid mass. There is no evidence of malignancy elsewhere.  The patient has undergone a CT scan of the chest abdomen and pelvis. No evidence of metastatic disease seen. No evidence of pelvic or abdominal adenopathy seen. There was mild concentric rectal wall thickening consistent with known rectal carcinoma. The patient was scheduled for an intrarectal ultrasound later this week on Thursday.   PREVIOUS RADIATION THERAPY: No   PAST MEDICAL HISTORY:  has a past medical history of CAD (coronary artery disease); Hypertension; Stroke; GERD (gastroesophageal reflux disease); Diarrhea; Colon cancer (10/17/12); and IBS (irritable bowel syndrome).     PAST SURGICAL HISTORY: Past Surgical History    Procedure Laterality Date  . Knee surgery      bilateral  . Colonoscopy w/ biopsies  10/17/12    rectosigmoid=adenocarcinoma     FAMILY HISTORY: family history includes Cancer in his daughter; Diabetes in his brother; Heart disease in his mother; Hypertension in his brother.   SOCIAL HISTORY:  reports that he has never smoked. He has never used smokeless tobacco. He reports that he does not drink alcohol or use illicit drugs.   ALLERGIES: Review of patient's allergies indicates no known allergies.   MEDICATIONS:  Current Outpatient Prescriptions  Medication Sig Dispense Refill  . amLODipine (NORVASC) 10 MG tablet Take 10 mg by mouth daily.      Marland Kitchen diltiazem (CARDIZEM CD) 240 MG 24 hr capsule Take 240 mg by mouth daily.      . hydrochlorothiazide (HYDRODIURIL) 25 MG tablet Take 25 mg by mouth daily.      . Levothyroxine Sodium (SYNTHROID PO) Take 25 mcg by mouth daily.       Marland Kitchen loperamide (IMODIUM) 2 MG capsule Take 2 mg by mouth 2 (two) times daily.      Marland Kitchen losartan (COZAAR) 100 MG tablet Take 100 mg by mouth daily.      . potassium chloride SA (K-DUR,KLOR-CON) 20 MEQ tablet Take 1 tablet (20 mEq total) by mouth daily.  30 tablet  0  . Rosuvastatin Calcium (CRESTOR PO) Take 1 tablet by mouth daily.      . sertraline (ZOLOFT) 25 MG tablet Take 25 mg by mouth daily.       No current facility-administered medications for this encounter.     REVIEW OF  SYSTEMS:  A 15 point review of systems is documented in the electronic medical record. This was obtained by the nursing staff. However, I reviewed this with the patient to discuss relevant findings and make appropriate changes.  Pertinent items are noted in HPI.    PHYSICAL EXAM:  height is 6\' 2"  (1.88 m) and weight is 196 lb 6.4 oz (89.086 kg). His oral temperature is 98.5 F (36.9 C). His blood pressure is 108/80 and his pulse is 84. His respiration is 20.      LABORATORY DATA:  Lab Results  Component Value Date   WBC 8.8  10/15/2012   HGB 13.3 10/15/2012   HCT 39.9 10/15/2012   MCV 84.9 10/15/2012   PLT 375.0 10/15/2012   Lab Results  Component Value Date   NA 136 10/17/2012   K 3.0* 10/17/2012   CL 101 10/17/2012   CO2 28 10/17/2012   Lab Results  Component Value Date   ALT 16 10/17/2012   AST 21 10/17/2012   ALKPHOS 68 10/17/2012   BILITOT 0.8 10/17/2012      RADIOGRAPHY: Ct Chest W Contrast  10/21/2012   CLINICAL DATA:  NEWLY DIAGNOSED RECTAL MASS. RECTOSIGMOID CARCINOMA.  EXAM: CT CHEST, ABDOMEN, AND PELVIS WITH CONTRAST  TECHNIQUE: Multidetector CT imaging of the chest, abdomen and pelvis was performed following the standard protocol during bolus administration of intravenous contrast.  CONTRAST:  OMNIPAQUE IOHEXOL 300 MG/ML  SOLN  COMPARISON:  None.  FINDINGS: CT CHEST FINDINGS  No evidence of mediastinal or hilar masses. No adenopathy seen elsewhere within the thorax. A small hiatal hernia is noted.  No evidence of pleural or pericardial effusion. No suspicious pulmonary nodules or masses are identified. No evidence of pulmonary infiltrate or central endobronchial obstruction. No evidence of chest wall mass or suspicious bone lesions.  CT ABDOMEN AND PELVIS FINDINGS  Small cyst noted in the posterior right hepatic lobe, however no liver masses are identified. The gallbladder, pancreas, spleen, and adrenal glands are normal in appearance. Small benign appearing renal cysts are noted bilaterally however there is no evidence of renal masses or hydronephrosis.  Diffuse rectal wall thickening is seen, consistent with known rectal carcinoma. There is mild soft tissue stranding in the perirectal fat, however no pathologically enlarged lymph nodes are seen. Mildly enlarged prostate gland noted. Mild diffuse bladder wall thickening, consistent with chronic bladder outlet obstruction. No evidence of pelvic or abdominal lymphadenopathy. No other soft tissue masses identified.  Large stool burden noted extensive colonic diverticulosis  noted, however there is no evidence of diverticulitis. No other inflammatory process or abnormal fluid collections are identified. No suspicious bone lesions identified. Lumbar spine degenerative changes are noted as well as a mild superior endplate compression fracture of the L1 vertebral body which appears old.  IMPRESSION: CT CHEST IMPRESSION  No evidence of thoracic metastatic disease or other acute findings.  Small hiatal hernia.  CT ABDOMEN AND PELVIS IMPRESSION  Mild concentric rectal wall thickening, consistent with known rectal carcinoma. No evidence of nodal or distant metastatic disease.  Large colonic stool burden and extensive diverticulosis. No evidence of diverticulitis.  Mildly enlarged prostate and chronic bladder outlet obstruction.   Electronically Signed   By: Myles Rosenthal   On: 10/21/2012 15:54   Ct Abdomen Pelvis W Contrast  10/21/2012   CLINICAL DATA:  NEWLY DIAGNOSED RECTAL MASS. RECTOSIGMOID CARCINOMA.  EXAM: CT CHEST, ABDOMEN, AND PELVIS WITH CONTRAST  TECHNIQUE: Multidetector CT imaging of the chest, abdomen and pelvis was  performed following the standard protocol during bolus administration of intravenous contrast.  CONTRAST:  OMNIPAQUE IOHEXOL 300 MG/ML  SOLN  COMPARISON:  None.  FINDINGS: CT CHEST FINDINGS  No evidence of mediastinal or hilar masses. No adenopathy seen elsewhere within the thorax. A small hiatal hernia is noted.  No evidence of pleural or pericardial effusion. No suspicious pulmonary nodules or masses are identified. No evidence of pulmonary infiltrate or central endobronchial obstruction. No evidence of chest wall mass or suspicious bone lesions.  CT ABDOMEN AND PELVIS FINDINGS  Small cyst noted in the posterior right hepatic lobe, however no liver masses are identified. The gallbladder, pancreas, spleen, and adrenal glands are normal in appearance. Small benign appearing renal cysts are noted bilaterally however there is no evidence of renal masses or  hydronephrosis.  Diffuse rectal wall thickening is seen, consistent with known rectal carcinoma. There is mild soft tissue stranding in the perirectal fat, however no pathologically enlarged lymph nodes are seen. Mildly enlarged prostate gland noted. Mild diffuse bladder wall thickening, consistent with chronic bladder outlet obstruction. No evidence of pelvic or abdominal lymphadenopathy. No other soft tissue masses identified.  Large stool burden noted extensive colonic diverticulosis noted, however there is no evidence of diverticulitis. No other inflammatory process or abnormal fluid collections are identified. No suspicious bone lesions identified. Lumbar spine degenerative changes are noted as well as a mild superior endplate compression fracture of the L1 vertebral body which appears old.  IMPRESSION: CT CHEST IMPRESSION  No evidence of thoracic metastatic disease or other acute findings.  Small hiatal hernia.  CT ABDOMEN AND PELVIS IMPRESSION  Mild concentric rectal wall thickening, consistent with known rectal carcinoma. No evidence of nodal or distant metastatic disease.  Large colonic stool burden and extensive diverticulosis. No evidence of diverticulitis.  Mildly enlarged prostate and chronic bladder outlet obstruction.   Electronically Signed   By: Myles Rosenthal   On: 10/21/2012 15:54       IMPRESSION: The patient is pleasant 79 year old gentleman the diagnosis of rectal cancer. He was accompanied today to clinic by his daughter. We had a  long discussion today regarding his diagnosis, prognosis, and recommended further workup. We also discussed treatment options in detail including surgery, radiation, and chemotherapy as possible options. The patient had questions about the necessity of the endorectal ultrasound and I discussed this in detail with him. He agreed to proceed with this treatment.  I discussed a possible role of radiotherapy with the patient and we discussed a typical course of  neoadjuvant radiation. This is not yet a recommendation for him but we did discuss this as a possible recommendation. We discussed the possible side effects and risks of treatment of this as well and all of his questions were answered.  The patient has questions regarding whether anything should be done at this time. The patient feels that he has had a full life and he is not sure that he wants to proceed with aggressive treatment. The patient's daughter also indicates that his wife has substantial difficulties, I believe with Alzheimer's disease, and this also weighs very heavily on the patient according to her. The patient will proceed with endorectal ultrasound later this week.   PLAN:   He is scheduled to followup with medical oncology. I will look for this result and then proceed accordingly. Radiation at this point could be used in a variety of settings including as part of try modality treatment, as palliative radiation with chemotherapy, or potentially even radiation  alone.     I spent 65 minutes minutes face to face with the patient and more than 50% of that time was spent in counseling and/or coordination of care.    ________________________________   Radene Gunning, MD, PhD

## 2012-11-07 ENCOUNTER — Encounter (HOSPITAL_COMMUNITY)
Admission: RE | Disposition: A | Discharge status: Home / Self Care | Payer: Self-pay | Source: Ambulatory Visit | Attending: Gastroenterology

## 2012-11-07 ENCOUNTER — Ambulatory Visit (HOSPITAL_COMMUNITY)
Admission: RE | Admit: 2012-11-07 | Discharge: 2012-11-07 | Disposition: A | Discharge status: Home / Self Care | Payer: Medicare Other | Source: Ambulatory Visit | Attending: Gastroenterology | Admitting: Gastroenterology

## 2012-11-07 ENCOUNTER — Encounter (HOSPITAL_COMMUNITY): Payer: Self-pay | Admitting: *Deleted

## 2012-11-07 DIAGNOSIS — I10 Essential (primary) hypertension: Secondary | ICD-10-CM | POA: Diagnosis not present

## 2012-11-07 DIAGNOSIS — D126 Benign neoplasm of colon, unspecified: Secondary | ICD-10-CM | POA: Diagnosis not present

## 2012-11-07 DIAGNOSIS — C20 Malignant neoplasm of rectum: Secondary | ICD-10-CM | POA: Diagnosis present

## 2012-11-07 DIAGNOSIS — F329 Major depressive disorder, single episode, unspecified: Secondary | ICD-10-CM | POA: Insufficient documentation

## 2012-11-07 DIAGNOSIS — I2510 Atherosclerotic heart disease of native coronary artery without angina pectoris: Secondary | ICD-10-CM | POA: Insufficient documentation

## 2012-11-07 DIAGNOSIS — Z79899 Other long term (current) drug therapy: Secondary | ICD-10-CM | POA: Diagnosis not present

## 2012-11-07 DIAGNOSIS — Z8673 Personal history of transient ischemic attack (TIA), and cerebral infarction without residual deficits: Secondary | ICD-10-CM | POA: Diagnosis not present

## 2012-11-07 DIAGNOSIS — K219 Gastro-esophageal reflux disease without esophagitis: Secondary | ICD-10-CM | POA: Diagnosis not present

## 2012-11-07 DIAGNOSIS — Z96659 Presence of unspecified artificial knee joint: Secondary | ICD-10-CM | POA: Insufficient documentation

## 2012-11-07 DIAGNOSIS — F3289 Other specified depressive episodes: Secondary | ICD-10-CM | POA: Insufficient documentation

## 2012-11-07 HISTORY — PX: EUS: SHX5427

## 2012-11-07 HISTORY — DX: Hypothyroidism, unspecified: E03.9

## 2012-11-07 SURGERY — ULTRASOUND, LOWER GI TRACT, ENDOSCOPIC
Anesthesia: Moderate Sedation

## 2012-11-07 MED ORDER — FLEET ENEMA 7-19 GM/118ML RE ENEM: 1.0000 | ENEMA | Freq: Once | RECTAL | Status: DC

## 2012-11-07 MED ORDER — SODIUM CHLORIDE 0.9 % IV SOLN
INTRAVENOUS | Status: DC
  Administered 2012-11-07: 500 mL via INTRAVENOUS

## 2012-11-07 MED ORDER — FENTANYL CITRATE 0.05 MG/ML IJ SOLN
INTRAMUSCULAR | Status: DC | PRN
  Administered 2012-11-07 (×3): 25 ug via INTRAVENOUS

## 2012-11-07 MED ORDER — FENTANYL CITRATE 0.05 MG/ML IJ SOLN
INTRAMUSCULAR | Status: AC
  Filled 2012-11-07: qty 2

## 2012-11-07 MED ORDER — MIDAZOLAM HCL 10 MG/2ML IJ SOLN
INTRAMUSCULAR | Status: AC
  Filled 2012-11-07: qty 2

## 2012-11-07 MED ORDER — SODIUM CHLORIDE 0.9 % IV SOLN: INTRAVENOUS | Status: DC

## 2012-11-07 MED ORDER — MIDAZOLAM HCL 10 MG/2ML IJ SOLN
INTRAMUSCULAR | Status: DC | PRN
  Administered 2012-11-07 (×3): 2 mg via INTRAVENOUS

## 2012-11-07 NOTE — Op Note (Signed)
[facility] [address] [place], [address]   ENDOSCOPIC ULTRASOUND PROCEDURE REPORT  PATIENT: Alexander Johnston, Alexander Johnston  MR#: [id number] BIRTHDATE: April 17, 1924  GENDER: Male ENDOSCOPIST: Rachael Fee, MD REFERRED BY:  Mardelle Matte, M.D. PROCEDURE DATE:  11/07/2012 PROCEDURE:   Lower EUS ASA CLASS:      Class III INDICATIONS:   recently diagnosed rectal adenocarcinoma (and 2cm suspicious descending colon polyp), Dr.  Arlyce Dice colonoscopy earlier this month; no clear metastatic disease on imaging. MEDICATIONS: Fentanyl 75 mcg IV and Versed 6 mg IV  DESCRIPTION OF PROCEDURE:   After the risks benefits and alternatives of the procedure were  explained, informed consent was obtained. The patient was then placed in the left, lateral, decubitus postion and IV sedation was administered. Throughout the procedure, the patients blood pressure, pulse and oxygen saturations were monitored continuously.  Under direct visualization, the Pentax Radial EUS [id number]  endoscope was introduced through the anus  and advanced to the sigmoid colon . Water was used as necessary to provide an acoustic interface.  Upon completion of the imaging, water was removed and the patient was sent to the recovery room in satisfactory condition.   Sigmoidoscopic findings: 1. Circumferential, friable, non-obstructing ulcerated mass in rectum. The mass is 7cm long and the distal edge is 3-4cm from the anal verge.  EUS findings: 1. The mass above correlates with a hypoechoic, heterogeneous, circumferential mass that clearly passes into and through the muscularis propria layer of the rectal wall (uT3).  The mass was 1.2cm in depth. 2. No perirectal adenopathy.  Impression: uT3N0 circumferential, non-obstructing, 7cm long adenocarcinoma in rectum with distal edge 3-4cm from the anal verge. He would likely benefiti from neo-adjuvant chemo/XRT.  I did not visualize the large polyp in descending colon  that was also noted and labeled by Dr. Arlyce Dice.   _______________________________ eSigned:  Rachael Fee, MD 11/07/2012 11:04 AM   cc: Melvia Heaps, MD; Almond Lint, MD; Dorothy Puffer, MD

## 2012-11-07 NOTE — H&P (View-Only) (Signed)
[facility] New Patient Consult   Referring MD: Saliou Barnier 79 y.o.  13-Mar-1924    Reason for Referral: Rectal cancer     HPI: He was referred to Dr. Arlyce Dice for evaluation of "diarrhea ". He reports intermittent diarrhea for the past 4 years. He describes frequent small volume stools that include mucous and blood. He has rectal urgency.  He was taken to a colonoscopy on 10/17/2012. Rectal exam confirmed a mass at 3-4 cm from the anal verge. The preparation was poor. A circumferential mass was noted beginning at 3-4 cm from the anal verge and extending at least 10-12 cm proximally. Multiple biopsies were obtained. The mass was tattooed. A 2 cm polyp was noted in the descending colon. The polyp was biopsied and tattooed. Diffuse melanosis coli. Sessile polyps were removed from the a sending colon and submitted to pathology. Erythema was noted throughout the colon with mild edema in the sigmoid.  The pathology [id number]) revealed a tubulovillous adenoma of the ascending colon, random colon biopsy with melanosis coli and no evidence of inflammatory bowel disease or malignancy. The rectosigmoid mass biopsy revealed adenocarcinoma. The descending colon biopsy revealed a tubulovillous adenoma without high-grade dysplasia.  CTs of the chest, abdomen, and pelvis on 10/21/2012 revealed no evidence of metastatic disease in the chest. No liver masses to diffuse rectal wall thickening was noted. Mild soft tissue stranding in the perirectal fat with no pathologically enlarged lymph nodes. No pelvic or abdominal lymphadenopathy. No other soft tissue masses. Large colonic stool burden and extensive diverticulosis.  He was referred to Dr. Donell Beers. She recommends an EUS to define the clinical stage prior to deciding on surgery versus neoadjuvant therapy.    Past Medical History  Diagnosis Date  . CAD (coronary artery disease)   . Hypertension   .  TIA   approximately  2 years ago   . GERD (gastroesophageal reflux disease)     .   Depression                                                                                              started an antidepressant approximately 2 weeks ago  Past Surgical History  Procedure Laterality Date  . Knee surgery      bilateral knee replacement    .   Removal of a "palate "tumor                                                                                                     50 years ago  Family History  Problem Relation Age of Onset  . Heart disease Mother     "all her family"  . Diabetes Brother   .  Hypertension Brother   . Cancer Daughter      gestational trophoblastic disease   No other family history of cancer  Current outpatient prescriptions:amLODipine (NORVASC) 10 MG tablet, Take 10 mg by mouth daily., Disp: , Rfl: ;  diltiazem (CARDIZEM CD) 240 MG 24 hr capsule, Take 240 mg by mouth daily., Disp: , Rfl: ;  hydrochlorothiazide (HYDRODIURIL) 25 MG tablet, Take 25 mg by mouth daily., Disp: , Rfl: ;  Levothyroxine Sodium (SYNTHROID PO), Take 1 tablet by mouth daily., Disp: , Rfl:  losartan (COZAAR) 100 MG tablet, Take 100 mg by mouth daily., Disp: , Rfl: ;  Rosuvastatin Calcium (CRESTOR PO), Take 1 tablet by mouth daily., Disp: , Rfl: ;  sertraline (ZOLOFT) 25 MG tablet, Take 25 mg by mouth daily., Disp: , Rfl:   Allergies: No Known Allergies  Social History:   He is a retired Glass blower/designer. He does not use tobacco or alcohol. No transfusion history. No risk factor for HIV or hepatitis.  ROS:   Positives include: 60 pound weight loss over the past 2 years-she relates this to not eating in an attempt to control the "diarrhea ", intermittent leakage of stool-wearing a diaper or brief, intermittent rectal bleeding for several years, poison ivy/poison sumac rash recently  A complete ROS was otherwise negative.  Physical Exam:  Blood pressure 134/70, pulse 70, temperature 98.6 F (37 C),  temperature source Oral, resp. rate 20, height 6\' 2"  (1.88 m), weight 193 lb 1.6 oz (87.59 kg).  HEENT: Oropharynx without visible mass, neck without mass Lungs: Lungs clear bilaterally Cardiac: Regular rate and rhythm, distant heart sounds Abdomen: No hepatomegaly, nontender, no mass, no splenomegaly,? Reducible left inguinal hernia GU: Testes without mass  Vascular: 1+ pitting edema at the left lower leg/ankle, trace pitting edema at the right lower leg/ankle Lymph nodes: No cervical, supraclavicular, axillary, or inguinal nodes Neurologic: Alert and oriented, the motor exam appears intact in the upper and lower extremities Skin: Sebaceous cyst at the left upper back   LAB:  CBC  Lab Results  Component Value Date   WBC 8.8 10/15/2012   HGB 13.3 10/15/2012   HCT 39.9 10/15/2012   MCV 84.9 10/15/2012   PLT 375.0 10/15/2012     CMP      Component Value Date/Time   NA 136 10/17/2012 1630   K 3.0* 10/17/2012 1630   CL 101 10/17/2012 1630   CO2 28 10/17/2012 1630   GLUCOSE 91 10/17/2012 1630   BUN 8 10/17/2012 1630   CREATININE 0.7 10/17/2012 1630   CALCIUM 8.4 10/17/2012 1630   PROT 6.0 10/17/2012 1630   ALBUMIN 3.0* 10/17/2012 1630   AST 21 10/17/2012 1630   ALT 16 10/17/2012 1630   ALKPHOS 68 10/17/2012 1630   BILITOT 0.8 10/17/2012 1630   10/17/2012-CEA 17.3  Radiology: As per history of present illness    Assessment/Plan:   1. Adenocarcinoma of the distal rectum, staging EUS pending, CTs of the chest, abdomen, and pelvis without evidence of metastatic disease  2. Elevated CEA  3. "Diarrhea "/rectal urgency  4. Weight loss  5. History of coronary artery disease  6. Depression  7. Multiple colon polyps on the colonoscopy 10/17/2012   Disposition:   Alexander Johnston has been diagnosed with rectal cancer. I discussed the diagnosis and treatment options with Alexander Johnston and his daughter. He will be referred for a rectal EUS to define the clinical stage. We will then decide on the indication  for chemotherapy/radiation versus  surgery.  He appears to have a good performance status despite his age and the reported weight loss. He appears to be a candidate for Xeloda/radiation. If he is not a surgical candidate he can be treated with chemotherapy/radiation as primary therapy and then observed.  His case will be presented at the GI tumor conference after the endoscopic ultrasound.  Alexander Johnston indicated he is depressed and question the role of treatment. He agrees to the endoscopic ultrasound and a followup visit. He will also be referred to Dr. Mitzi Hansen.  Approximately 60 minutes were spent with the patient today. The majority of the time was used for counseling/coordination of care  [facility], Alexander Johnston 10/31/2012, 5:39 PM

## 2012-11-07 NOTE — Interval H&P Note (Signed)
History and Physical Interval Note:  11/07/2012 10:31 AM  Alexander Johnston  has presented today for surgery, with the diagnosis of Rectal cancer [154.1]  The various methods of treatment have been discussed with the patient and family. After consideration of risks, benefits and other options for treatment, the patient has consented to  Procedure(s): LOWER ENDOSCOPIC ULTRASOUND (EUS) (N/A) as a surgical intervention .  The patient's history has been reviewed, patient examined, no change in status, stable for surgery.  I have reviewed the patient's chart and labs.  Questions were answered to the patient's satisfaction.     Rachael Fee

## 2012-11-08 ENCOUNTER — Encounter (HOSPITAL_COMMUNITY): Payer: Self-pay | Admitting: Gastroenterology

## 2012-11-08 ENCOUNTER — Telehealth: Payer: Self-pay | Admitting: Oncology

## 2012-11-08 NOTE — Telephone Encounter (Signed)
Pt called r/s lab and ML to 10/6 from 10/2

## 2012-11-13 ENCOUNTER — Telehealth: Payer: Self-pay | Admitting: *Deleted

## 2012-11-13 NOTE — Telephone Encounter (Signed)
Spoke with patient's daughter and confirmed all appointments.  Her father will come back in to discuss treatment options vs no treatment on 11/18/12.  Dr. Joellen [place] office is working on scheduling an appointment.  Patient's daughter is aware.

## 2012-11-14 ENCOUNTER — Other Ambulatory Visit: Payer: Medicare Other | Admitting: Lab

## 2012-11-14 ENCOUNTER — Ambulatory Visit: Payer: Medicare Other | Admitting: Nurse Practitioner

## 2012-11-18 ENCOUNTER — Ambulatory Visit: Payer: Medicare Other

## 2012-11-18 ENCOUNTER — Ambulatory Visit (HOSPITAL_BASED_OUTPATIENT_CLINIC_OR_DEPARTMENT_OTHER): Payer: Medicare Other | Admitting: Nurse Practitioner

## 2012-11-18 ENCOUNTER — Ambulatory Visit: Payer: Medicare Other | Admitting: Radiation Oncology

## 2012-11-18 ENCOUNTER — Telehealth: Payer: Self-pay | Admitting: Oncology

## 2012-11-18 ENCOUNTER — Encounter: Payer: Self-pay | Admitting: *Deleted

## 2012-11-18 ENCOUNTER — Other Ambulatory Visit (HOSPITAL_BASED_OUTPATIENT_CLINIC_OR_DEPARTMENT_OTHER): Payer: Medicare Other | Admitting: Lab

## 2012-11-18 ENCOUNTER — Other Ambulatory Visit: Payer: Self-pay | Admitting: *Deleted

## 2012-11-18 VITALS — BP 129/74 | HR 73 | Temp 97.8°F | Resp 18 | Ht 74.0 in | Wt 194.7 lb

## 2012-11-18 DIAGNOSIS — C20 Malignant neoplasm of rectum: Secondary | ICD-10-CM

## 2012-11-18 DIAGNOSIS — R634 Abnormal weight loss: Secondary | ICD-10-CM

## 2012-11-18 DIAGNOSIS — F329 Major depressive disorder, single episode, unspecified: Secondary | ICD-10-CM

## 2012-11-18 DIAGNOSIS — R197 Diarrhea, unspecified: Secondary | ICD-10-CM

## 2012-11-18 LAB — BASIC METABOLIC PANEL (CC13)
BUN: 18.7 mg/dL (ref 7.0–26.0)
Calcium: 9.1 mg/dL (ref 8.4–10.4)
Chloride: 108 mEq/L (ref 98–109)
Creatinine: 0.7 mg/dL (ref 0.7–1.3)
Glucose: 87 mg/dl (ref 70–140)

## 2012-11-18 MED ORDER — CAPECITABINE 500 MG PO TABS: 1500.0000 mg | ORAL_TABLET | Freq: Two times a day (BID) | ORAL | Status: DC

## 2012-11-18 NOTE — Progress Notes (Signed)
Met with patient and his daughter.  Patient to decide on treatment today.  He and his daughter expressed need to talk with Artist and SW.  Patient to see them today.  Patient's daughter requested HIM send records to their visiting home physician, Dr. Sherri Rad.  Release of information form completed and signed per patient and returned to HIM.   Will continue to follow as needed.

## 2012-11-18 NOTE — Progress Notes (Addendum)
OFFICE PROGRESS NOTE  Interval history:  Alexander Johnston is an 79 year old man recently diagnosed with rectal cancer. Endoscopic ultrasound on 11/07/2012 showed a circumferential friable nonobstructing ulcerated mass in the rectum. The mass was 7 cm long and the distal edge was 3-4 cm from the anal verge. The mass correlated with a hypoechoic, heterogeneous, circumferential mass that clearly passed into and through the muscularis propria layer of the rectal wall (uT3). The mass was 1.2 cm in depth. There was no perirectal adenopathy.  He is seen today for scheduled followup. He continues to have diarrhea. He had rectal bleeding last week. None thus far this week. He denies rectal pain. Appetite is good. No shortness of breath or cough. He denies chest and abdominal pain.  He is concerned regarding the financial aspect of chemotherapy. He is also concerned about his wife who has Alzheimer's. She lives at a nursing facility. He is worried he will not be able to visit her regularly if he takes treatment.  Objective: Blood pressure 129/74, pulse 73, temperature 97.8 F (36.6 C), temperature source Oral, resp. rate 18, height 6\' 2"  (1.88 m), weight 194 lb 11.2 oz (88.315 kg), SpO2 98.00%.  Oropharynx is without thrush or ulceration. Mucous membranes are moist. No palpable cervical or supraclavicular lymph nodes. Lungs are clear. Regular cardiac rhythm. Abdomen is soft and nontender. No hepatomegaly. Trace lower leg edema bilaterally. No skin rash. Alert and oriented.  Lab Results: Lab Results  Component Value Date   WBC 8.8 10/15/2012   HGB 13.3 10/15/2012   HCT 39.9 10/15/2012   MCV 84.9 10/15/2012   PLT 375.0 10/15/2012    Chemistry:    Chemistry      Component Value Date/Time   NA 138 11/18/2012 1346   NA 136 10/17/2012 1630   K 4.1 11/18/2012 1346   K 3.0* 10/17/2012 1630   CL 101 10/17/2012 1630   CO2 24 11/18/2012 1346   CO2 28 10/17/2012 1630   BUN 18.7 11/18/2012 1346   BUN 8 10/17/2012 1630    CREATININE 0.7 11/18/2012 1346   CREATININE 0.7 10/17/2012 1630      Component Value Date/Time   CALCIUM 9.1 11/18/2012 1346   CALCIUM 8.4 10/17/2012 1630   ALKPHOS 68 10/17/2012 1630   AST 21 10/17/2012 1630   ALT 16 10/17/2012 1630   BILITOT 0.8 10/17/2012 1630       Studies/Results: Ct Chest W Contrast  10/21/2012   CLINICAL DATA:  NEWLY DIAGNOSED RECTAL MASS. RECTOSIGMOID CARCINOMA.  EXAM: CT CHEST, ABDOMEN, AND PELVIS WITH CONTRAST  TECHNIQUE: Multidetector CT imaging of the chest, abdomen and pelvis was performed following the standard protocol during bolus administration of intravenous contrast.  CONTRAST:  OMNIPAQUE IOHEXOL 300 MG/ML  SOLN  COMPARISON:  None.  FINDINGS: CT CHEST FINDINGS  No evidence of mediastinal or hilar masses. No adenopathy seen elsewhere within the thorax. A small hiatal hernia is noted.  No evidence of pleural or pericardial effusion. No suspicious pulmonary nodules or masses are identified. No evidence of pulmonary infiltrate or central endobronchial obstruction. No evidence of chest wall mass or suspicious bone lesions.  CT ABDOMEN AND PELVIS FINDINGS  Small cyst noted in the posterior right hepatic lobe, however no liver masses are identified. The gallbladder, pancreas, spleen, and adrenal glands are normal in appearance. Small benign appearing renal cysts are noted bilaterally however there is no evidence of renal masses or hydronephrosis.  Diffuse rectal wall thickening is seen, consistent with known rectal carcinoma. There  is mild soft tissue stranding in the perirectal fat, however no pathologically enlarged lymph nodes are seen. Mildly enlarged prostate gland noted. Mild diffuse bladder wall thickening, consistent with chronic bladder outlet obstruction. No evidence of pelvic or abdominal lymphadenopathy. No other soft tissue masses identified.  Large stool burden noted extensive colonic diverticulosis noted, however there is no evidence of diverticulitis. No other  inflammatory process or abnormal fluid collections are identified. No suspicious bone lesions identified. Lumbar spine degenerative changes are noted as well as a mild superior endplate compression fracture of the L1 vertebral body which appears old.  IMPRESSION: CT CHEST IMPRESSION  No evidence of thoracic metastatic disease or other acute findings.  Small hiatal hernia.  CT ABDOMEN AND PELVIS IMPRESSION  Mild concentric rectal wall thickening, consistent with known rectal carcinoma. No evidence of nodal or distant metastatic disease.  Large colonic stool burden and extensive diverticulosis. No evidence of diverticulitis.  Mildly enlarged prostate and chronic bladder outlet obstruction.   Electronically Signed   By: Myles Rosenthal   On: 10/21/2012 15:54   Ct Abdomen Pelvis W Contrast  10/21/2012   CLINICAL DATA:  NEWLY DIAGNOSED RECTAL MASS. RECTOSIGMOID CARCINOMA.  EXAM: CT CHEST, ABDOMEN, AND PELVIS WITH CONTRAST  TECHNIQUE: Multidetector CT imaging of the chest, abdomen and pelvis was performed following the standard protocol during bolus administration of intravenous contrast.  CONTRAST:  OMNIPAQUE IOHEXOL 300 MG/ML  SOLN  COMPARISON:  None.  FINDINGS: CT CHEST FINDINGS  No evidence of mediastinal or hilar masses. No adenopathy seen elsewhere within the thorax. A small hiatal hernia is noted.  No evidence of pleural or pericardial effusion. No suspicious pulmonary nodules or masses are identified. No evidence of pulmonary infiltrate or central endobronchial obstruction. No evidence of chest wall mass or suspicious bone lesions.  CT ABDOMEN AND PELVIS FINDINGS  Small cyst noted in the posterior right hepatic lobe, however no liver masses are identified. The gallbladder, pancreas, spleen, and adrenal glands are normal in appearance. Small benign appearing renal cysts are noted bilaterally however there is no evidence of renal masses or hydronephrosis.  Diffuse rectal wall thickening is seen, consistent with  known rectal carcinoma. There is mild soft tissue stranding in the perirectal fat, however no pathologically enlarged lymph nodes are seen. Mildly enlarged prostate gland noted. Mild diffuse bladder wall thickening, consistent with chronic bladder outlet obstruction. No evidence of pelvic or abdominal lymphadenopathy. No other soft tissue masses identified.  Large stool burden noted extensive colonic diverticulosis noted, however there is no evidence of diverticulitis. No other inflammatory process or abnormal fluid collections are identified. No suspicious bone lesions identified. Lumbar spine degenerative changes are noted as well as a mild superior endplate compression fracture of the L1 vertebral body which appears old.  IMPRESSION: CT CHEST IMPRESSION  No evidence of thoracic metastatic disease or other acute findings.  Small hiatal hernia.  CT ABDOMEN AND PELVIS IMPRESSION  Mild concentric rectal wall thickening, consistent with known rectal carcinoma. No evidence of nodal or distant metastatic disease.  Large colonic stool burden and extensive diverticulosis. No evidence of diverticulitis.  Mildly enlarged prostate and chronic bladder outlet obstruction.   Electronically Signed   By: Myles Rosenthal   On: 10/21/2012 15:54    Medications: I have reviewed the patient's current medications.  Assessment/Plan:  1. Adenocarcinoma of the distal rectum.   CTs of the chest, abdomen and pelvis without evidence of metastatic disease.   EUS 11/07/2012 showed a uT3N0 circumferential nonobstructing 7 cm  long adenocarcinoma in the rectum with distal edge 3-4 cm from the anal verge. 2. Elevated CEA. 3. Diarrhea/rectal urgency secondary to #1. 4. Weight loss. 5. History of coronary artery disease. 6. Depression. 7. Multiple colon polyps on colonoscopy 10/17/2012.  Disposition-Dr. Truett Perna reviewed the diagnosis and treatment options with Mr. Ryser and his daughter. The first option discussed was no treatment,  second option chemotherapy/radiation therapy, third option chemotherapy/radiation therapy followed by surgery and fourth option possible upfront surgery.  Dr. Truett Perna recommends concurrent Xeloda/radiation therapy followed by evaluation for possible surgery.  We reviewed potential toxicities associated with Xeloda including myelosuppression, nausea, mouth sores, diarrhea, hand-foot syndrome, skin rash, conjunctivitis-like symptoms.  He will attend a chemotherapy education class. We will obtain baseline labs on the day of the education class to include CBC, chemistry panel and CEA. We will submit a prescription to  managed care for Xeloda.  Dr. Truett Perna has discussed his case with Dr. Mitzi Hansen. Dr. Mitzi Hansen will make arrangements to schedule him for simulation.  We anticipate he will begin Xeloda/radiation around 12/02/2012. Dr. Truett Perna will see him in followup on 12/12/2012. He understands to contact the office in the interim with any problems.  Patient seen with Dr. Truett Perna.  30 minutes of today's visit were spent in face-to-face contact with greater than 50% of that time spent in counseling/coordination of care.  Lonna Cobb ANP/GNP-BC   This was a shared visit with Lonna Cobb. I discussed the EUS findings and treatment options with Mr. Fullilove and his daughter. I discussed the case with doctors [place] and [place]. His case was discussed at the GI tumor conference last week.  We recommend proceeding with neoadjuvant therapy. He will then see Dr. Donell Beers to discuss surgical options. Mr. Kissner agrees to Xeloda/radiation. He is concerned about the cost of treatment and potential toxicities. He understands treatment can be discontinued if he develops excessive toxicity. The plan is to begin Xeloda/radiation after the radiation treatment planning is completed.  Mancel Bale, M.D.

## 2012-11-18 NOTE — Telephone Encounter (Signed)
GV AND PRINTED APPT SCHED AND AVS FOR PT FOR oct °

## 2012-11-18 NOTE — Progress Notes (Signed)
[facility] Psychosocial Distress Screening Clinical Social Work  Clinical Social Work was referred by distress screening protocol.  The patient scored a 7 on the Psychosocial Distress Thermometer which indicates moderate distress. Clinical Social Worker met with pt and pt's daughter in exam room to assess for distress and other psychosocial needs.  Pt expressed multiple concerns with starting treatment and struggles with the possibility of "becoming a burden on his family".  CSW and pt processed his feelings associated with his diagnosis and treatment.  CSW discussed support services at [facility] and encouraged pt and family to cal with any questions or concerns.  CSW will continue to follow and support pt and family as needed.    Tamala Julian, MSW, LCSW Clinical Social Worker [facility] [phone]

## 2012-11-19 ENCOUNTER — Other Ambulatory Visit (HOSPITAL_BASED_OUTPATIENT_CLINIC_OR_DEPARTMENT_OTHER): Payer: Medicare Other | Admitting: Lab

## 2012-11-19 ENCOUNTER — Encounter: Payer: Self-pay | Admitting: *Deleted

## 2012-11-19 ENCOUNTER — Other Ambulatory Visit: Payer: Medicare Other

## 2012-11-19 DIAGNOSIS — C20 Malignant neoplasm of rectum: Secondary | ICD-10-CM

## 2012-11-19 LAB — CBC WITH DIFFERENTIAL/PLATELET
Basophils Absolute: 0.1 10*3/uL (ref 0.0–0.1)
Eosinophils Absolute: 0.6 10*3/uL — ABNORMAL HIGH (ref 0.0–0.5)
HGB: 12.1 g/dL — ABNORMAL LOW (ref 13.0–17.1)
MCV: 83.8 fL (ref 79.3–98.0)
MONO%: 10.2 % (ref 0.0–14.0)
NEUT#: 4.9 10*3/uL (ref 1.5–6.5)
RBC: 4.42 10*6/uL (ref 4.20–5.82)
RDW: 15.4 % — ABNORMAL HIGH (ref 11.0–14.6)
WBC: 7.9 10*3/uL (ref 4.0–10.3)
lymph#: 1.5 10*3/uL (ref 0.9–3.3)

## 2012-11-19 LAB — COMPREHENSIVE METABOLIC PANEL (CC13)
AST: 13 U/L (ref 5–34)
Albumin: 2.8 g/dL — ABNORMAL LOW (ref 3.5–5.0)
Alkaline Phosphatase: 71 U/L (ref 40–150)
CO2: 25 mEq/L (ref 22–29)
Calcium: 8.8 mg/dL (ref 8.4–10.4)
Chloride: 106 mEq/L (ref 98–109)
Glucose: 103 mg/dl (ref 70–140)
Potassium: 4.2 mEq/L (ref 3.5–5.1)
Sodium: 137 mEq/L (ref 136–145)
Total Protein: 5.7 g/dL — ABNORMAL LOW (ref 6.4–8.3)

## 2012-11-20 ENCOUNTER — Ambulatory Visit
Admission: RE | Admit: 2012-11-20 | Discharge: 2012-11-20 | Disposition: A | Discharge status: Home / Self Care | Payer: Medicare Other | Source: Ambulatory Visit | Attending: Radiation Oncology | Admitting: Radiation Oncology

## 2012-11-20 ENCOUNTER — Encounter: Payer: Self-pay | Admitting: Oncology

## 2012-11-20 DIAGNOSIS — X58XXXA Exposure to other specified factors, initial encounter: Secondary | ICD-10-CM | POA: Insufficient documentation

## 2012-11-20 DIAGNOSIS — R197 Diarrhea, unspecified: Secondary | ICD-10-CM | POA: Insufficient documentation

## 2012-11-20 DIAGNOSIS — S32009A Unspecified fracture of unspecified lumbar vertebra, initial encounter for closed fracture: Secondary | ICD-10-CM | POA: Insufficient documentation

## 2012-11-20 DIAGNOSIS — K5730 Diverticulosis of large intestine without perforation or abscess without bleeding: Secondary | ICD-10-CM | POA: Insufficient documentation

## 2012-11-20 DIAGNOSIS — C20 Malignant neoplasm of rectum: Secondary | ICD-10-CM

## 2012-11-20 DIAGNOSIS — Q619 Cystic kidney disease, unspecified: Secondary | ICD-10-CM | POA: Insufficient documentation

## 2012-11-20 DIAGNOSIS — N320 Bladder-neck obstruction: Secondary | ICD-10-CM | POA: Insufficient documentation

## 2012-11-20 DIAGNOSIS — N400 Enlarged prostate without lower urinary tract symptoms: Secondary | ICD-10-CM | POA: Insufficient documentation

## 2012-11-20 DIAGNOSIS — K449 Diaphragmatic hernia without obstruction or gangrene: Secondary | ICD-10-CM | POA: Insufficient documentation

## 2012-11-20 DIAGNOSIS — Z510 Encounter for antineoplastic radiation therapy: Secondary | ICD-10-CM | POA: Insufficient documentation

## 2012-11-20 LAB — CEA: CEA: 14.5 ng/mL — ABNORMAL HIGH (ref 0.0–5.0)

## 2012-11-20 NOTE — Progress Notes (Signed)
Radiation Oncology         [phone] ________________________________  Name: Alexander Johnston MRN: [id number]  Date: 11/20/2012  DOB: [birth year]  Follow-Up Visit Note  CC: Florentina Jenny, MD  Ladene Artist, MD  Diagnosis:   Rectal cancer  Interval Since Last Radiation:  Not applicable   Narrative:  The patient returns today for routine follow-up.  The patient returns for a followup today and he has been scheduled for a simulation later this morning. The patient originally was unsure of whether he wanted to proceed with radiation as well as other aggressive measures such as chemotherapy. He has decided to proceed with this. He therefore will be planned to receive a 5-1/2 week course of radiation treatment with concurrent chemotherapy. He discussed this plan with Dr. Truett Perna earlier this week.                              ALLERGIES:  has No Known Allergies.  Meds: Current Outpatient Prescriptions  Medication Sig Dispense Refill  . amLODipine (NORVASC) 10 MG tablet Take 10 mg by mouth daily.      . capecitabine (XELODA) 500 MG tablet Take 3 tablets (1,500 mg total) by mouth 2 (two) times daily after a meal. On days of radiation only. (Mon-Fri)  168 tablet  0  . diltiazem (CARDIZEM CD) 240 MG 24 hr capsule Take 240 mg by mouth daily.      . hydrochlorothiazide (HYDRODIURIL) 25 MG tablet Take 25 mg by mouth daily.      . Levothyroxine Sodium (SYNTHROID PO) Take 25 mcg by mouth daily.       Marland Kitchen loperamide (IMODIUM) 2 MG capsule Take 2 mg by mouth 2 (two) times daily.      Marland Kitchen losartan (COZAAR) 100 MG tablet Take 100 mg by mouth daily.      . potassium chloride SA (K-DUR,KLOR-CON) 20 MEQ tablet Take 1 tablet (20 mEq total) by mouth daily.  30 tablet  0  . Rosuvastatin Calcium (CRESTOR PO) Take 1 tablet by mouth daily.      . sertraline (ZOLOFT) 25 MG tablet Take 25 mg by mouth daily.       No current facility-administered medications for this encounter.    Physical Findings: The patient  is in no acute distress. Patient is alert and oriented.  vitals were not taken for this visit..     Lab Findings: Lab Results  Component Value Date   WBC 7.9 11/19/2012   HGB 12.1* 11/19/2012   HCT 37.1* 11/19/2012   MCV 83.8 11/19/2012   PLT 300 11/19/2012     Radiographic Findings: Ct Chest W Contrast  10/21/2012   CLINICAL DATA:  NEWLY DIAGNOSED RECTAL MASS. RECTOSIGMOID CARCINOMA.  EXAM: CT CHEST, ABDOMEN, AND PELVIS WITH CONTRAST  TECHNIQUE: Multidetector CT imaging of the chest, abdomen and pelvis was performed following the standard protocol during bolus administration of intravenous contrast.  CONTRAST:  OMNIPAQUE IOHEXOL 300 MG/ML  SOLN  COMPARISON:  None.  FINDINGS: CT CHEST FINDINGS  No evidence of mediastinal or hilar masses. No adenopathy seen elsewhere within the thorax. A small hiatal hernia is noted.  No evidence of pleural or pericardial effusion. No suspicious pulmonary nodules or masses are identified. No evidence of pulmonary infiltrate or central endobronchial obstruction. No evidence of chest wall mass or suspicious bone lesions.  CT ABDOMEN AND PELVIS FINDINGS  Small cyst noted in the posterior right hepatic lobe,  however no liver masses are identified. The gallbladder, pancreas, spleen, and adrenal glands are normal in appearance. Small benign appearing renal cysts are noted bilaterally however there is no evidence of renal masses or hydronephrosis.  Diffuse rectal wall thickening is seen, consistent with known rectal carcinoma. There is mild soft tissue stranding in the perirectal fat, however no pathologically enlarged lymph nodes are seen. Mildly enlarged prostate gland noted. Mild diffuse bladder wall thickening, consistent with chronic bladder outlet obstruction. No evidence of pelvic or abdominal lymphadenopathy. No other soft tissue masses identified.  Large stool burden noted extensive colonic diverticulosis noted, however there is no evidence of diverticulitis. No  other inflammatory process or abnormal fluid collections are identified. No suspicious bone lesions identified. Lumbar spine degenerative changes are noted as well as a mild superior endplate compression fracture of the L1 vertebral body which appears Johnston.  IMPRESSION: CT CHEST IMPRESSION  No evidence of thoracic metastatic disease or other acute findings.  Small hiatal hernia.  CT ABDOMEN AND PELVIS IMPRESSION  Mild concentric rectal wall thickening, consistent with known rectal carcinoma. No evidence of nodal or distant metastatic disease.  Large colonic stool burden and extensive diverticulosis. No evidence of diverticulitis.  Mildly enlarged prostate and chronic bladder outlet obstruction.   Electronically Signed   By: Myles Rosenthal   On: 10/21/2012 15:54   Ct Abdomen Pelvis W Contrast  10/21/2012   CLINICAL DATA:  NEWLY DIAGNOSED RECTAL MASS. RECTOSIGMOID CARCINOMA.  EXAM: CT CHEST, ABDOMEN, AND PELVIS WITH CONTRAST  TECHNIQUE: Multidetector CT imaging of the chest, abdomen and pelvis was performed following the standard protocol during bolus administration of intravenous contrast.  CONTRAST:  OMNIPAQUE IOHEXOL 300 MG/ML  SOLN  COMPARISON:  None.  FINDINGS: CT CHEST FINDINGS  No evidence of mediastinal or hilar masses. No adenopathy seen elsewhere within the thorax. A small hiatal hernia is noted.  No evidence of pleural or pericardial effusion. No suspicious pulmonary nodules or masses are identified. No evidence of pulmonary infiltrate or central endobronchial obstruction. No evidence of chest wall mass or suspicious bone lesions.  CT ABDOMEN AND PELVIS FINDINGS  Small cyst noted in the posterior right hepatic lobe, however no liver masses are identified. The gallbladder, pancreas, spleen, and adrenal glands are normal in appearance. Small benign appearing renal cysts are noted bilaterally however there is no evidence of renal masses or hydronephrosis.  Diffuse rectal wall thickening is seen, consistent  with known rectal carcinoma. There is mild soft tissue stranding in the perirectal fat, however no pathologically enlarged lymph nodes are seen. Mildly enlarged prostate gland noted. Mild diffuse bladder wall thickening, consistent with chronic bladder outlet obstruction. No evidence of pelvic or abdominal lymphadenopathy. No other soft tissue masses identified.  Large stool burden noted extensive colonic diverticulosis noted, however there is no evidence of diverticulitis. No other inflammatory process or abnormal fluid collections are identified. No suspicious bone lesions identified. Lumbar spine degenerative changes are noted as well as a mild superior endplate compression fracture of the L1 vertebral body which appears Johnston.  IMPRESSION: CT CHEST IMPRESSION  No evidence of thoracic metastatic disease or other acute findings.  Small hiatal hernia.  CT ABDOMEN AND PELVIS IMPRESSION  Mild concentric rectal wall thickening, consistent with known rectal carcinoma. No evidence of nodal or distant metastatic disease.  Large colonic stool burden and extensive diverticulosis. No evidence of diverticulitis.  Mildly enlarged prostate and chronic bladder outlet obstruction.   Electronically Signed   By: Myles Rosenthal  On: 10/21/2012 15:54    Impression/Plan:    The patient is suitable to proceed with radiation treatment planning today. We will plan for 5-1/2 weeks of radiation with concurrent Xeloda chemotherapy.  The patient is having some diarrhea and we discussed going up on his use of the.   I spent 15 minutes with the patient today, the majority of which was spent counseling the patient on the diagnosis of cancer and coordinating care.   Radene Gunning, M.D., Ph.D.

## 2012-11-20 NOTE — Progress Notes (Signed)
Recon, here for ct simulation today,has had Chemotherapy education,  11/18/12, patient ambulating with cane, slow steady gait, has numerous vloody red loose stools daily, takes only imodium bid,  Appetite is good but he limits because of the loose stools,  No c/o pain, no c/o nausea 10:33 AM

## 2012-11-20 NOTE — Progress Notes (Signed)
Please see the Nurse Progress Note in the MD Initial Consult Encounter for this patient. 

## 2012-11-20 NOTE — Progress Notes (Signed)
Per [facility] Xeloda was approved for 11/20/12-11/19/13  7500.00. I will send to medical records and billing.

## 2012-11-21 ENCOUNTER — Telehealth: Payer: Self-pay | Admitting: Oncology

## 2012-11-21 ENCOUNTER — Other Ambulatory Visit: Payer: Self-pay | Admitting: *Deleted

## 2012-11-21 NOTE — Telephone Encounter (Signed)
Called pt, talked to daughter gave her appt for 12/10/12

## 2012-11-25 NOTE — Progress Notes (Signed)
  Radiation Oncology         [phone] ________________________________  Name: Alexander Johnston MRN: [id number]  Date: 11/20/2012  DOB: 12-12-1924  SIMULATION AND TREATMENT PLANNING NOTE  The patient presented for simulation for the patient's upcoming course of preoperative radiation for the diagnosis of rectal cancer. The patient was placed in a supine position. A customized alpha cradle was constructed toaid in patient immobilization. This complex treatment device will be used on a daily basis during the treatment. In this fashion a CT scan was obtained through the pelvic region and the isocenter was placed near midline within the pelvis.  The patient will initially be planned to receive a course of radiation to a dose of 45 gray. This will be accomplished in 25 fractions at 1.8 gray per fraction. This initial treatment will correspond to a 3-D conformal technique. The gross tumor volume has been contoured in addition to the rectum, bladder and femoral heads. DVH's of each of these structures have been requested and these will be carefully reviewed as part of the 3-D conformal treatment planning process. To accomplish this initial treatment, 4 customized blocks have been designed for this purpose. Each of these 4 complex treatment devices will be used on a daily basis during the initial course of his treatment. It is anticipated that the patient will then receive a boost for an additional 5.4 gray. The anticipated total dose therefore will be 50.4 gray.  Special treatment procedure The patient will receive chemotherapy during the course of radiation treatment. The patient may experience increased or overlapping toxicity due to this combined-modality approach and the patient will be monitored for such problems. This may include extra lab work as necessary. This therefore constitutes a special treatment procedure.    ________________________________  Radene Gunning, MD, PhD

## 2012-11-27 ENCOUNTER — Ambulatory Visit
Admission: RE | Admit: 2012-11-27 | Discharge: 2012-11-27 | Disposition: A | Discharge status: Home / Self Care | Payer: Medicare Other | Source: Ambulatory Visit | Attending: Radiation Oncology | Admitting: Radiation Oncology

## 2012-11-27 ENCOUNTER — Ambulatory Visit: Payer: Medicare Other | Admitting: Radiation Oncology

## 2012-11-27 DIAGNOSIS — C20 Malignant neoplasm of rectum: Secondary | ICD-10-CM

## 2012-11-28 ENCOUNTER — Ambulatory Visit: Payer: Medicare Other

## 2012-11-28 ENCOUNTER — Ambulatory Visit
Admission: RE | Admit: 2012-11-28 | Discharge: 2012-11-28 | Disposition: A | Discharge status: Home / Self Care | Payer: Medicare Other | Source: Ambulatory Visit | Attending: Radiation Oncology | Admitting: Radiation Oncology

## 2012-11-28 NOTE — Progress Notes (Signed)
  Radiation Oncology         [phone] ________________________________  Name: Alexander Johnston MRN: [id number]  Date: 11/27/2012  DOB: April 07, 1924  Simulation Verification Note   NARRATIVE: The patient was brought to the treatment unit and placed in the planned treatment position. The clinical setup was verified. Then port films were obtained and uploaded to the radiation oncology medical record software.  The treatment beams were carefully compared against the planned radiation fields. The position, location, and shape of the radiation fields was reviewed. The targeted volume of tissue appears to be appropriately covered by the radiation beams. Based on my personal review, I approved the simulation verification. The patient's treatment will proceed as planned.  ________________________________   Radene Gunning, MD, PhD

## 2012-11-29 ENCOUNTER — Ambulatory Visit
Admission: RE | Admit: 2012-11-29 | Discharge: 2012-11-29 | Disposition: A | Discharge status: Home / Self Care | Payer: Medicare Other | Source: Ambulatory Visit | Attending: Radiation Oncology | Admitting: Radiation Oncology

## 2012-11-29 ENCOUNTER — Ambulatory Visit: Payer: Medicare Other

## 2012-11-29 ENCOUNTER — Encounter: Payer: Self-pay | Admitting: Radiation Oncology

## 2012-11-29 VITALS — BP 125/65 | HR 73 | Temp 97.8°F | Ht 74.0 in

## 2012-11-29 DIAGNOSIS — C20 Malignant neoplasm of rectum: Secondary | ICD-10-CM

## 2012-11-29 NOTE — Progress Notes (Signed)
   Department of Radiation Oncology  Phone:  [phone] Fax:        [phone]  Weekly Treatment Note    Name: Alexander Johnston Date: 11/29/2012 MRN: [id number] DOB: 1924-06-18   Current dose: 5.4 Gy  Current fraction: 3   MEDICATIONS: Current Outpatient Prescriptions  Medication Sig Dispense Refill  . amLODipine (NORVASC) 10 MG tablet Take 10 mg by mouth daily.      . capecitabine (XELODA) 500 MG tablet Take 3 tablets (1,500 mg total) by mouth 2 (two) times daily after a meal. On days of radiation only. (Mon-Fri)  168 tablet  0  . diltiazem (CARDIZEM CD) 240 MG 24 hr capsule Take 240 mg by mouth daily.      . hydrochlorothiazide (HYDRODIURIL) 25 MG tablet Take 25 mg by mouth daily.      . Levothyroxine Sodium (SYNTHROID PO) Take 25 mcg by mouth daily.       Marland Kitchen loperamide (IMODIUM) 2 MG capsule Take 2 mg by mouth 2 (two) times daily.      Marland Kitchen losartan (COZAAR) 100 MG tablet Take 100 mg by mouth daily.      . potassium chloride SA (K-DUR,KLOR-CON) 20 MEQ tablet Take 1 tablet (20 mEq total) by mouth daily.  30 tablet  0  . Rosuvastatin Calcium (CRESTOR PO) Take 1 tablet by mouth daily.      . sertraline (ZOLOFT) 25 MG tablet Take 25 mg by mouth daily.       No current facility-administered medications for this encounter.     ALLERGIES: Review of patient's allergies indicates no known allergies.   LABORATORY DATA:  Lab Results  Component Value Date   WBC 7.9 11/19/2012   HGB 12.1* 11/19/2012   HCT 37.1* 11/19/2012   MCV 83.8 11/19/2012   PLT 300 11/19/2012   Lab Results  Component Value Date   NA 137 11/19/2012   K 4.2 11/19/2012   CL 101 10/17/2012   CO2 25 11/19/2012   Lab Results  Component Value Date   ALT 8 11/19/2012   AST 13 11/19/2012   ALKPHOS 71 11/19/2012   BILITOT 0.34 11/19/2012     NARRATIVE: Alexander Johnston was seen today for weekly treatment management. The chart was checked and the patient's films were reviewed. The patient is doing excellent in his  first week of treatment. No complaints or concerns at this time.  PHYSICAL EXAMINATION: height is 6\' 2"  (1.88 m). His temperature is 97.8 F (36.6 C). His blood pressure is 125/65 and his pulse is 73.        ASSESSMENT: The patient is doing satisfactorily with treatment.  PLAN: We will continue with the patient's radiation treatment as planned.

## 2012-11-29 NOTE — Progress Notes (Addendum)
Mr. Shieh has received 3 fractions to his pelvis.  Denies any pain.  He has frequent stools which has been his normal.  Education with Mr. Born and his daughter on yesterday.  Reviewed side-effect management of potential side-effects such as proctitis, dysuria, pain, and skin irritation, and fatigue.  Given Radiation Therapy and You booklet.

## 2012-12-02 ENCOUNTER — Ambulatory Visit
Admission: RE | Admit: 2012-12-02 | Discharge: 2012-12-02 | Disposition: A | Discharge status: Home / Self Care | Payer: Medicare Other | Source: Ambulatory Visit | Attending: Radiation Oncology | Admitting: Radiation Oncology

## 2012-12-02 ENCOUNTER — Ambulatory Visit: Payer: Medicare Other

## 2012-12-03 ENCOUNTER — Ambulatory Visit
Admission: RE | Admit: 2012-12-03 | Discharge: 2012-12-03 | Disposition: A | Discharge status: Home / Self Care | Payer: Medicare Other | Source: Ambulatory Visit | Attending: Radiation Oncology | Admitting: Radiation Oncology

## 2012-12-03 ENCOUNTER — Ambulatory Visit: Payer: Medicare Other

## 2012-12-04 ENCOUNTER — Ambulatory Visit
Admission: RE | Admit: 2012-12-04 | Discharge: 2012-12-04 | Disposition: A | Discharge status: Home / Self Care | Payer: Medicare Other | Source: Ambulatory Visit | Attending: Radiation Oncology | Admitting: Radiation Oncology

## 2012-12-04 ENCOUNTER — Ambulatory Visit: Payer: Medicare Other

## 2012-12-05 ENCOUNTER — Encounter: Payer: Self-pay | Admitting: Radiation Oncology

## 2012-12-05 ENCOUNTER — Ambulatory Visit
Admission: RE | Admit: 2012-12-05 | Discharge: 2012-12-05 | Disposition: A | Discharge status: Home / Self Care | Payer: Medicare Other | Source: Ambulatory Visit | Attending: Radiation Oncology | Admitting: Radiation Oncology

## 2012-12-05 ENCOUNTER — Ambulatory Visit: Payer: Medicare Other

## 2012-12-05 VITALS — BP 127/62 | HR 65 | Temp 98.6°F | Ht 74.0 in | Wt 202.3 lb

## 2012-12-05 DIAGNOSIS — C20 Malignant neoplasm of rectum: Secondary | ICD-10-CM

## 2012-12-05 NOTE — Addendum Note (Signed)
Encounter addended by: Delynn Flavin, RN on: 12/05/2012  7:49 PM<BR>     Documentation filed: Inpatient Document Flowsheet, Notes Section

## 2012-12-05 NOTE — Progress Notes (Signed)
   Department of Radiation Oncology  Phone:  [phone] Fax:        [phone]  Weekly Treatment Note    Name: Alexander Johnston Date: 12/05/2012 MRN: [id number] DOB: September 06, 1924   Current dose: 12.6 Gy  Current fraction: 7   MEDICATIONS: Current Outpatient Prescriptions  Medication Sig Dispense Refill  . amLODipine (NORVASC) 10 MG tablet Take 10 mg by mouth daily.      . capecitabine (XELODA) 500 MG tablet Take 3 tablets (1,500 mg total) by mouth 2 (two) times daily after a meal. On days of radiation only. (Mon-Fri)  168 tablet  0  . diltiazem (CARDIZEM CD) 240 MG 24 hr capsule Take 240 mg by mouth daily.      . hydrochlorothiazide (HYDRODIURIL) 25 MG tablet Take 25 mg by mouth daily.      . Levothyroxine Sodium (SYNTHROID PO) Take 25 mcg by mouth daily.       Marland Kitchen loperamide (IMODIUM) 2 MG capsule Take 2 mg by mouth 2 (two) times daily.      Marland Kitchen losartan (COZAAR) 100 MG tablet Take 100 mg by mouth daily.      . Rosuvastatin Calcium (CRESTOR PO) Take 1 tablet by mouth daily.      . sertraline (ZOLOFT) 25 MG tablet Take 25 mg by mouth daily.      . potassium chloride SA (K-DUR,KLOR-CON) 20 MEQ tablet Take 1 tablet (20 mEq total) by mouth daily.  30 tablet  0   No current facility-administered medications for this encounter.     ALLERGIES: Review of patient's allergies indicates no known allergies.   LABORATORY DATA:  Lab Results  Component Value Date   WBC 7.9 11/19/2012   HGB 12.1* 11/19/2012   HCT 37.1* 11/19/2012   MCV 83.8 11/19/2012   PLT 300 11/19/2012   Lab Results  Component Value Date   NA 137 11/19/2012   K 4.2 11/19/2012   CL 101 10/17/2012   CO2 25 11/19/2012   Lab Results  Component Value Date   ALT 8 11/19/2012   AST 13 11/19/2012   ALKPHOS 71 11/19/2012   BILITOT 0.34 11/19/2012     NARRATIVE: [name] was seen today for weekly treatment management. The chart was checked and the patient's films were reviewed. The patient states that he is doing  excellent. He heals that his bowel movements really have become normal. No rectal pain. No significant diarrhea.  PHYSICAL EXAMINATION: height is 6\' 2"  (1.88 m) and weight is 202 lb 4.8 oz (91.763 kg). His temperature is 98.6 F (37 C). His blood pressure is 127/62 and his pulse is 65.        ASSESSMENT: The patient is doing satisfactorily with treatment.  PLAN: We will continue with the patient's radiation treatment as planned.

## 2012-12-05 NOTE — Progress Notes (Addendum)
Alexander Johnston has received 7 fractions to his pelvis/recdtum.  He denies any pain ,but admits to intermittent rectal itching that he states he has had "all of his life".  He appears more rested and states he feels better and reports that his digestive system " is almost back to normal." At the present time he does not have any diarrhea.  He denies any changes in his urinary pattern.

## 2012-12-06 ENCOUNTER — Ambulatory Visit
Admission: RE | Admit: 2012-12-06 | Discharge: 2012-12-06 | Disposition: A | Discharge status: Home / Self Care | Payer: Medicare Other | Source: Ambulatory Visit | Attending: Radiation Oncology | Admitting: Radiation Oncology

## 2012-12-06 ENCOUNTER — Ambulatory Visit: Payer: Medicare Other

## 2012-12-09 ENCOUNTER — Ambulatory Visit
Admission: RE | Admit: 2012-12-09 | Discharge: 2012-12-09 | Disposition: A | Discharge status: Home / Self Care | Payer: Medicare Other | Source: Ambulatory Visit | Attending: Radiation Oncology | Admitting: Radiation Oncology

## 2012-12-09 ENCOUNTER — Ambulatory Visit: Payer: Medicare Other

## 2012-12-10 ENCOUNTER — Other Ambulatory Visit (HOSPITAL_BASED_OUTPATIENT_CLINIC_OR_DEPARTMENT_OTHER): Payer: Medicare Other | Admitting: Lab

## 2012-12-10 ENCOUNTER — Ambulatory Visit: Payer: Medicare Other

## 2012-12-10 ENCOUNTER — Ambulatory Visit
Admission: RE | Admit: 2012-12-10 | Discharge: 2012-12-10 | Disposition: A | Discharge status: Home / Self Care | Payer: Medicare Other | Source: Ambulatory Visit | Attending: Radiation Oncology | Admitting: Radiation Oncology

## 2012-12-10 ENCOUNTER — Ambulatory Visit (HOSPITAL_BASED_OUTPATIENT_CLINIC_OR_DEPARTMENT_OTHER): Payer: Medicare Other | Admitting: Oncology

## 2012-12-10 VITALS — BP 117/55 | HR 73 | Temp 98.1°F | Resp 17 | Ht 74.0 in | Wt 203.3 lb

## 2012-12-10 DIAGNOSIS — C20 Malignant neoplasm of rectum: Secondary | ICD-10-CM

## 2012-12-10 DIAGNOSIS — D6481 Anemia due to antineoplastic chemotherapy: Secondary | ICD-10-CM

## 2012-12-10 LAB — CBC WITH DIFFERENTIAL/PLATELET
BASO%: 0.6 % (ref 0.0–2.0)
Basophils Absolute: 0 10*3/uL (ref 0.0–0.1)
EOS%: 9.5 % — ABNORMAL HIGH (ref 0.0–7.0)
HCT: 32.9 % — ABNORMAL LOW (ref 38.4–49.9)
LYMPH%: 15.1 % (ref 14.0–49.0)
MCH: 27.4 pg (ref 27.2–33.4)
MCHC: 33.1 g/dL (ref 32.0–36.0)
MONO#: 0.7 10*3/uL (ref 0.1–0.9)
MONO%: 10.8 % (ref 0.0–14.0)
NEUT%: 64 % (ref 39.0–75.0)
Platelets: 253 10*3/uL (ref 140–400)
RBC: 3.98 10*6/uL — ABNORMAL LOW (ref 4.20–5.82)

## 2012-12-10 NOTE — Progress Notes (Signed)
   [facility]    OFFICE PROGRESS NOTE   INTERVAL HISTORY:   He started radiation and Xeloda on 11/27/2012. He reports resolution of diarrhea and rectal bleeding. No mouth sores or hand/foot pain. No complaint.  Objective:  Vital signs in last 24 hours:  Blood pressure 117/55, pulse 73, temperature 98.1 F (36.7 C), temperature source Oral, resp. rate 17, height 6\' 2"  (1.88 m), weight 203 lb 4.8 oz (92.216 kg), SpO2 97.00%.    HEENT: No thrush or ulcer Resp: Lungs clear bilaterally Cardio: Regular rate and rhythm GI: No hepatomegaly Vascular: Pitting edema at the left greater than right lower  Skin: Palms without erythema, no erythema at the groin or perineum   Lab Results:  Lab Results  Component Value Date   WBC 6.5 12/10/2012   HGB 10.9* 12/10/2012   HCT 32.9* 12/10/2012   MCV 82.7 12/10/2012   PLT 253 12/10/2012   ANC 4.9    Medications: I have reviewed the patient's current medications.  Assessment/Plan: 1. Adenocarcinoma of the distal rectum.  CTs of the chest, abdomen and pelvis without evidence of metastatic disease.  EUS 11/07/2012 showed a uT3N0 circumferential nonobstructing 7 cm long adenocarcinoma in the rectum with distal edge 3-4 cm from the anal verge. Initiation of concurrent radiation and Xeloda 11/27/2012 2. Elevated CEA. 3. Diarrhea/rectal urgency secondary to #1. Improved. 4. History of Weight loss. 5. History of coronary artery disease. 6. Depression. 7. Multiple colon polyps on colonoscopy 10/17/2012. 8. Mild anemia secondary to rectal bleeding and chemotherapy  Disposition:  Alexander Johnston appears to be tolerating the Xeloda and radiation well. The rectal symptoms have improved. He will continue treatment. [name] will return for an office visit and CBC in 2 weeks. He declined an influenza vaccine.   Thornton Papas, MD  12/10/2012  4:50 PM

## 2012-12-10 NOTE — Progress Notes (Signed)
Patient reports he stopped taking his K+ supplement when his last Cmet returned normal. Started taking MVI daily. Suggested he check if it has folic acid and let Dr. Truett Perna know-this could intensify the side effects of his Xeloda.

## 2012-12-11 ENCOUNTER — Ambulatory Visit: Payer: Medicare Other

## 2012-12-11 ENCOUNTER — Ambulatory Visit
Admission: RE | Admit: 2012-12-11 | Discharge: 2012-12-11 | Disposition: A | Discharge status: Home / Self Care | Payer: Medicare Other | Source: Ambulatory Visit | Attending: Radiation Oncology | Admitting: Radiation Oncology

## 2012-12-12 ENCOUNTER — Other Ambulatory Visit: Payer: Medicare Other | Admitting: Lab

## 2012-12-12 ENCOUNTER — Ambulatory Visit
Admission: RE | Admit: 2012-12-12 | Discharge: 2012-12-12 | Disposition: A | Discharge status: Home / Self Care | Payer: Medicare Other | Source: Ambulatory Visit | Attending: Radiation Oncology | Admitting: Radiation Oncology

## 2012-12-12 ENCOUNTER — Ambulatory Visit: Payer: Medicare Other

## 2012-12-12 ENCOUNTER — Ambulatory Visit: Payer: Medicare Other | Admitting: Oncology

## 2012-12-12 ENCOUNTER — Telehealth: Payer: Self-pay | Admitting: Oncology

## 2012-12-12 NOTE — Telephone Encounter (Signed)
s.w. pt daughter and advised on 11.11.14appt....ok and awre

## 2012-12-13 ENCOUNTER — Ambulatory Visit: Payer: Medicare Other

## 2012-12-13 ENCOUNTER — Telehealth: Payer: Self-pay | Admitting: Dietician

## 2012-12-13 ENCOUNTER — Ambulatory Visit
Admission: RE | Admit: 2012-12-13 | Discharge: 2012-12-13 | Disposition: A | Discharge status: Home / Self Care | Payer: Medicare Other | Source: Ambulatory Visit | Attending: Radiation Oncology | Admitting: Radiation Oncology

## 2012-12-13 ENCOUNTER — Telehealth: Payer: Self-pay | Admitting: *Deleted

## 2012-12-13 VITALS — Wt 205.6 lb

## 2012-12-13 DIAGNOSIS — C20 Malignant neoplasm of rectum: Secondary | ICD-10-CM

## 2012-12-13 MED ORDER — POTASSIUM CHLORIDE CRYS ER 20 MEQ PO TBCR: 20.0000 meq | EXTENDED_RELEASE_TABLET | Freq: Every day | ORAL | Status: DC

## 2012-12-13 NOTE — Progress Notes (Signed)
Weekly rad txs, 13/25 rectal, no c/o pain, nausea or diarrhea, states bms daily rwegular, is some fatigued, gained 2 lbs in 1 week 10:19 AM

## 2012-12-13 NOTE — Telephone Encounter (Signed)
Per Dr. Truett Perna : He needs to continue to take his K+ supplement daily and stop the MVI.

## 2012-12-13 NOTE — Telephone Encounter (Signed)
Made family member aware that Dr. Truett Perna said to stop his MVI and resume K+

## 2012-12-13 NOTE — Progress Notes (Signed)
   Department of Radiation Oncology  Phone:  [phone] Fax:        [phone]  Weekly Treatment Note    Name: Alexander Johnston Date: 12/13/2012 MRN: [id number] DOB: Jun 04, 1924   Current dose: 23.4 Gy  Current fraction: 13   MEDICATIONS: Current Outpatient Prescriptions  Medication Sig Dispense Refill  . amLODipine (NORVASC) 10 MG tablet Take 10 mg by mouth daily.      . capecitabine (XELODA) 500 MG tablet Take 3 tablets (1,500 mg total) by mouth 2 (two) times daily after a meal. On days of radiation only. (Mon-Fri)  168 tablet  0  . diltiazem (CARDIZEM CD) 240 MG 24 hr capsule Take 240 mg by mouth daily.      . hydrochlorothiazide (HYDRODIURIL) 25 MG tablet Take 25 mg by mouth daily.      . Levothyroxine Sodium (SYNTHROID PO) Take 25 mcg by mouth daily.       Marland Kitchen loperamide (IMODIUM) 2 MG capsule Take 2 mg by mouth 2 (two) times daily.      Marland Kitchen losartan (COZAAR) 100 MG tablet Take 100 mg by mouth daily.      . Multiple Vitamin (ONE-A-DAY MENS PO) Take 1 tablet by mouth daily.      . potassium chloride SA (K-DUR,KLOR-CON) 20 MEQ tablet Take 1 tablet (20 mEq total) by mouth daily.  30 tablet  0  . Rosuvastatin Calcium (CRESTOR PO) Take 1 tablet by mouth daily.      . sertraline (ZOLOFT) 25 MG tablet Take 25 mg by mouth daily.       No current facility-administered medications for this encounter.     ALLERGIES: Review of patient's allergies indicates no known allergies.   LABORATORY DATA:  Lab Results  Component Value Date   WBC 6.5 12/10/2012   HGB 10.9* 12/10/2012   HCT 32.9* 12/10/2012   MCV 82.7 12/10/2012   PLT 253 12/10/2012   Lab Results  Component Value Date   NA 137 11/19/2012   K 4.2 11/19/2012   CL 101 10/17/2012   CO2 25 11/19/2012   Lab Results  Component Value Date   ALT 8 11/19/2012   AST 13 11/19/2012   ALKPHOS 71 11/19/2012   BILITOT 0.34 11/19/2012     NARRATIVE: Alexander Johnston was seen today for weekly treatment management. The chart was checked  and the patient's films were reviewed. The patient is doing excellent this week. He feels that his bowel movements really have become quite normal. This is a major change for him. No complaints this week.  PHYSICAL EXAMINATION: weight is 205 lb 9.6 oz (93.26 kg).        ASSESSMENT: The patient is doing satisfactorily with treatment.  PLAN: We will continue with the patient's radiation treatment as planned.

## 2012-12-16 ENCOUNTER — Ambulatory Visit
Admission: RE | Admit: 2012-12-16 | Discharge: 2012-12-16 | Disposition: A | Discharge status: Home / Self Care | Payer: Medicare Other | Source: Ambulatory Visit | Attending: Radiation Oncology | Admitting: Radiation Oncology

## 2012-12-16 ENCOUNTER — Ambulatory Visit: Payer: Medicare Other

## 2012-12-17 ENCOUNTER — Ambulatory Visit: Payer: Medicare Other

## 2012-12-17 ENCOUNTER — Ambulatory Visit
Admission: RE | Admit: 2012-12-17 | Discharge: 2012-12-17 | Disposition: A | Discharge status: Home / Self Care | Payer: Medicare Other | Source: Ambulatory Visit | Attending: Radiation Oncology | Admitting: Radiation Oncology

## 2012-12-18 ENCOUNTER — Ambulatory Visit
Admission: RE | Admit: 2012-12-18 | Discharge: 2012-12-18 | Disposition: A | Discharge status: Home / Self Care | Payer: Medicare Other | Source: Ambulatory Visit | Attending: Radiation Oncology | Admitting: Radiation Oncology

## 2012-12-18 ENCOUNTER — Ambulatory Visit: Payer: Medicare Other

## 2012-12-18 NOTE — Progress Notes (Signed)
Weekly assessment.Denies pain or nausea.Tolerating radiation and xeloda well.today was 16 of 25 treatments.No fatigue or any other concerns.Denies any rectal skin changes.

## 2012-12-19 ENCOUNTER — Ambulatory Visit: Payer: Medicare Other

## 2012-12-19 ENCOUNTER — Encounter: Payer: Self-pay | Admitting: Radiation Oncology

## 2012-12-19 ENCOUNTER — Ambulatory Visit
Admission: RE | Admit: 2012-12-19 | Discharge: 2012-12-19 | Disposition: A | Discharge status: Home / Self Care | Payer: Medicare Other | Source: Ambulatory Visit | Attending: Radiation Oncology | Admitting: Radiation Oncology

## 2012-12-20 ENCOUNTER — Ambulatory Visit
Admission: RE | Admit: 2012-12-20 | Discharge: 2012-12-20 | Disposition: A | Discharge status: Home / Self Care | Payer: Medicare Other | Source: Ambulatory Visit | Attending: Radiation Oncology | Admitting: Radiation Oncology

## 2012-12-20 ENCOUNTER — Other Ambulatory Visit: Payer: Self-pay | Admitting: Radiation Oncology

## 2012-12-20 ENCOUNTER — Ambulatory Visit: Payer: Medicare Other

## 2012-12-20 DIAGNOSIS — R300 Dysuria: Secondary | ICD-10-CM

## 2012-12-20 DIAGNOSIS — C20 Malignant neoplasm of rectum: Secondary | ICD-10-CM

## 2012-12-20 LAB — URINALYSIS, MICROSCOPIC - CHCC
Glucose: NEGATIVE mg/dL
Nitrite: NEGATIVE
Protein: 100 mg/dL
Specific Gravity, Urine: 1.02 (ref 1.003–1.035)
Urobilinogen, UR: 0.2 mg/dL (ref 0.2–1)
pH: 6 (ref 4.6–8.0)

## 2012-12-20 MED ORDER — CIPROFLOXACIN HCL 250 MG PO TABS: 250.0000 mg | ORAL_TABLET | Freq: Two times a day (BID) | ORAL | Status: DC

## 2012-12-23 ENCOUNTER — Ambulatory Visit: Payer: Medicare Other

## 2012-12-23 ENCOUNTER — Ambulatory Visit
Admission: RE | Admit: 2012-12-23 | Discharge: 2012-12-23 | Disposition: A | Discharge status: Home / Self Care | Payer: Medicare Other | Source: Ambulatory Visit | Attending: Radiation Oncology | Admitting: Radiation Oncology

## 2012-12-24 ENCOUNTER — Ambulatory Visit
Admission: RE | Admit: 2012-12-24 | Discharge: 2012-12-24 | Disposition: A | Discharge status: Home / Self Care | Payer: Medicare Other | Source: Ambulatory Visit | Attending: Radiation Oncology | Admitting: Radiation Oncology

## 2012-12-24 ENCOUNTER — Other Ambulatory Visit (HOSPITAL_BASED_OUTPATIENT_CLINIC_OR_DEPARTMENT_OTHER): Payer: Medicare Other | Admitting: Lab

## 2012-12-24 ENCOUNTER — Ambulatory Visit (HOSPITAL_BASED_OUTPATIENT_CLINIC_OR_DEPARTMENT_OTHER): Payer: Medicare Other | Admitting: Oncology

## 2012-12-24 ENCOUNTER — Ambulatory Visit: Payer: Medicare Other

## 2012-12-24 VITALS — BP 133/65 | HR 74 | Temp 98.5°F | Resp 18 | Ht 74.0 in | Wt 197.1 lb

## 2012-12-24 DIAGNOSIS — C20 Malignant neoplasm of rectum: Secondary | ICD-10-CM

## 2012-12-24 LAB — CBC WITH DIFFERENTIAL/PLATELET
Basophils Absolute: 0.1 10*3/uL (ref 0.0–0.1)
Eosinophils Absolute: 0.9 10*3/uL — ABNORMAL HIGH (ref 0.0–0.5)
HCT: 33.7 % — ABNORMAL LOW (ref 38.4–49.9)
HGB: 11 g/dL — ABNORMAL LOW (ref 13.0–17.1)
MCH: 27.6 pg (ref 27.2–33.4)
MONO#: 0.7 10*3/uL (ref 0.1–0.9)
NEUT#: 4.3 10*3/uL (ref 1.5–6.5)
NEUT%: 65.1 % (ref 39.0–75.0)
Platelets: 267 10*3/uL (ref 140–400)
RBC: 3.99 10*6/uL — ABNORMAL LOW (ref 4.20–5.82)
RDW: 15.9 % — ABNORMAL HIGH (ref 11.0–14.6)
WBC: 6.6 10*3/uL (ref 4.0–10.3)

## 2012-12-24 NOTE — Progress Notes (Signed)
   [facility]    OFFICE PROGRESS NOTE   INTERVAL HISTORY:   Alexander Johnston returns for scheduled followup of rectal cancer. He continues Xeloda and radiation. He reports resolution of diarrhea. He feels well. No mouth sores or hand/foot pain. Mild erythema at the rectum.  Objective:  Vital signs in last 24 hours:  Blood pressure 133/65, pulse 74, temperature 98.5 F (36.9 C), temperature source Oral, resp. rate 18, height 6\' 2"  (1.88 m), weight 197 lb 1.6 oz (89.404 kg), SpO2 100.00%.    HEENT: No thrush or ulcers Resp: Lungs clear bilaterally Cardio: Regular rate and rhythm GI: No hepatomegaly, nontender Vascular: Trace pitting edema at the left greater than right ankle  Skin: Mild erythema at the perineum and upper gluteal fold. No skin breakdown. Mild erythema at the soles.  Lab Results:  Lab Results  Component Value Date   WBC 6.6 12/24/2012   HGB 11.0* 12/24/2012   HCT 33.7* 12/24/2012   MCV 84.5 12/24/2012   PLT 267 12/24/2012   ANC 4.3    Medications: I have reviewed the patient's current medications.  Assessment/Plan: 1. Adenocarcinoma of the distal rectum.  CTs of the chest, abdomen and pelvis without evidence of metastatic disease.  EUS 11/07/2012 showed a uT3N0 circumferential nonobstructing 7 cm long adenocarcinoma in the rectum with distal edge 3-4 cm from the anal verge.  Initiation of concurrent radiation and Xeloda 11/27/2012 2. Elevated CEA. 3. Diarrhea/rectal urgency secondary to #1. Resolved. 4. History of Weight loss. 5. History of coronary artery disease. 6. Depression. 7. Multiple colon polyps on colonoscopy 10/17/2012. 8. Mild anemia secondary to rectal bleeding and chemotherapy-stable   Disposition:  He appears to be tolerating the Xeloda and radiation well. The rectal symptoms have resolved. He will complete treatment over the next 2 weeks. We will refer him to Dr. Donell Beers for a restaging examination at the completion of  treatment. [name] will return for an office visit in 2 months. He will contact us in the interim for new symptoms.   Thornton Papas, MD  12/24/2012  4:35 PM

## 2012-12-25 ENCOUNTER — Ambulatory Visit
Admission: RE | Admit: 2012-12-25 | Discharge: 2012-12-25 | Disposition: A | Discharge status: Home / Self Care | Payer: Medicare Other | Source: Ambulatory Visit | Attending: Radiation Oncology | Admitting: Radiation Oncology

## 2012-12-25 ENCOUNTER — Ambulatory Visit: Payer: Medicare Other

## 2012-12-26 ENCOUNTER — Telehealth: Payer: Self-pay | Admitting: Oncology

## 2012-12-26 ENCOUNTER — Ambulatory Visit: Payer: Medicare Other

## 2012-12-26 ENCOUNTER — Ambulatory Visit
Admission: RE | Admit: 2012-12-26 | Discharge: 2012-12-26 | Disposition: A | Discharge status: Home / Self Care | Payer: Medicare Other | Source: Ambulatory Visit | Attending: Radiation Oncology | Admitting: Radiation Oncology

## 2012-12-27 ENCOUNTER — Ambulatory Visit
Admission: RE | Admit: 2012-12-27 | Discharge: 2012-12-27 | Disposition: A | Discharge status: Home / Self Care | Payer: Medicare Other | Source: Ambulatory Visit | Attending: Radiation Oncology | Admitting: Radiation Oncology

## 2012-12-27 ENCOUNTER — Ambulatory Visit: Payer: Medicare Other

## 2012-12-27 VITALS — BP 158/63 | HR 58 | Temp 97.4°F | Resp 20 | Wt 200.7 lb

## 2012-12-27 DIAGNOSIS — C20 Malignant neoplasm of rectum: Secondary | ICD-10-CM

## 2012-12-27 NOTE — Progress Notes (Signed)
   Department of Radiation Oncology  Phone:  [phone] Fax:        [phone]  Weekly Treatment Note    Name: EVO ADERMAN Date: 12/27/2012 MRN: [id number] DOB: March 20, 1924   Current dose: 41.4 Gy  Current fraction: 23   MEDICATIONS: Current Outpatient Prescriptions  Medication Sig Dispense Refill  . amLODipine (NORVASC) 10 MG tablet Take 10 mg by mouth daily.      . capecitabine (XELODA) 500 MG tablet Take 3 tablets (1,500 mg total) by mouth 2 (two) times daily after a meal. On days of radiation only. (Mon-Fri)  168 tablet  0  . diltiazem (CARDIZEM CD) 240 MG 24 hr capsule Take 240 mg by mouth daily.      . hydrochlorothiazide (HYDRODIURIL) 25 MG tablet Take 25 mg by mouth daily.      . Levothyroxine Sodium (SYNTHROID PO) Take 25 mcg by mouth daily.       Marland Kitchen loperamide (IMODIUM) 2 MG capsule Take 2 mg by mouth 2 (two) times daily.      Marland Kitchen losartan (COZAAR) 100 MG tablet Take 100 mg by mouth daily.      . Multiple Vitamin (ONE-A-DAY MENS PO) Take 1 tablet by mouth daily.      . phenylephrine (,USE FOR PREPARATION-H,) 0.25 % suppository Place 1 suppository rectally 2 (two) times daily.      . potassium chloride SA (K-DUR,KLOR-CON) 20 MEQ tablet Take 1 tablet (20 mEq total) by mouth daily.  30 tablet  2  . Rosuvastatin Calcium (CRESTOR PO) Take 1 tablet by mouth daily.      . sertraline (ZOLOFT) 25 MG tablet Take 25 mg by mouth daily.       No current facility-administered medications for this encounter.     ALLERGIES: Review of patient's allergies indicates no known allergies.   LABORATORY DATA:  Lab Results  Component Value Date   WBC 6.6 12/24/2012   HGB 11.0* 12/24/2012   HCT 33.7* 12/24/2012   MCV 84.5 12/24/2012   PLT 267 12/24/2012   Lab Results  Component Value Date   NA 137 11/19/2012   K 4.2 11/19/2012   CL 101 10/17/2012   CO2 25 11/19/2012   Lab Results  Component Value Date   ALT 8 11/19/2012   AST 13 11/19/2012   ALKPHOS 71 11/19/2012   BILITOT  0.34 11/19/2012     NARRATIVE: [name] was seen today for weekly treatment management. The chart was checked and the patient's films were reviewed. The patient is doing well. His diarrhea has resolved. Some irritation in the anal region but this has been tolerable.  PHYSICAL EXAMINATION: weight is 200 lb 11.2 oz (91.037 kg). His oral temperature is 97.4 F (36.3 C). His blood pressure is 158/63 and his pulse is 58. His respiration is 20.        ASSESSMENT: The patient is doing satisfactorily with treatment.  PLAN: We will continue with the patient's radiation treatment as planned. The patient will continue using preparation H. as needed.

## 2012-12-27 NOTE — Progress Notes (Signed)
Weekly rad txs colon  tolerationg Xeloda with radiation

## 2012-12-30 ENCOUNTER — Ambulatory Visit: Payer: Medicare Other

## 2012-12-30 ENCOUNTER — Ambulatory Visit
Admission: RE | Admit: 2012-12-30 | Discharge: 2012-12-30 | Disposition: A | Discharge status: Home / Self Care | Payer: Medicare Other | Source: Ambulatory Visit | Attending: Radiation Oncology | Admitting: Radiation Oncology

## 2012-12-31 ENCOUNTER — Ambulatory Visit
Admission: RE | Admit: 2012-12-31 | Discharge: 2012-12-31 | Disposition: A | Discharge status: Home / Self Care | Payer: Medicare Other | Source: Ambulatory Visit | Attending: Radiation Oncology | Admitting: Radiation Oncology

## 2012-12-31 ENCOUNTER — Ambulatory Visit: Payer: Medicare Other

## 2012-12-31 DIAGNOSIS — C20 Malignant neoplasm of rectum: Secondary | ICD-10-CM

## 2012-12-31 MED ORDER — HYDROCODONE-ACETAMINOPHEN 5-325 MG PO TABS: 1.0000 | ORAL_TABLET | Freq: Four times a day (QID) | ORAL | Status: DC | PRN

## 2013-01-01 ENCOUNTER — Ambulatory Visit: Payer: Medicare Other

## 2013-01-01 ENCOUNTER — Ambulatory Visit
Admission: RE | Admit: 2013-01-01 | Discharge: 2013-01-01 | Disposition: A | Discharge status: Home / Self Care | Payer: Medicare Other | Source: Ambulatory Visit | Attending: Radiation Oncology | Admitting: Radiation Oncology

## 2013-01-01 DIAGNOSIS — C20 Malignant neoplasm of rectum: Secondary | ICD-10-CM

## 2013-01-01 NOTE — Progress Notes (Signed)
  Radiation Oncology         [phone] ________________________________  Name: Alexander Johnston MRN: [id number]  Date: 01/01/2013  DOB: 01-Mar-1924  Simulation Verification Note   NARRATIVE: The patient was brought to the treatment unit and placed in the planned treatment position for his boost treatment. The clinical setup was verified. Then port films were obtained and uploaded to the radiation oncology medical record software.  The treatment beams were carefully compared against the planned radiation fields. The position, location, and shape of the radiation fields was reviewed. The targeted volume of tissue appears to be appropriately covered by the radiation beams. Based on my personal review, I approved the simulation verification. The patient's treatment will proceed as planned.  ________________________________   Radene Gunning, MD, PhD

## 2013-01-02 ENCOUNTER — Ambulatory Visit
Admission: RE | Admit: 2013-01-02 | Discharge: 2013-01-02 | Disposition: A | Discharge status: Home / Self Care | Payer: Medicare Other | Source: Ambulatory Visit | Attending: Radiation Oncology | Admitting: Radiation Oncology

## 2013-01-02 ENCOUNTER — Ambulatory Visit: Payer: Medicare Other

## 2013-01-03 ENCOUNTER — Ambulatory Visit: Payer: Medicare Other

## 2013-01-03 ENCOUNTER — Ambulatory Visit: Payer: Medicare Other | Admitting: Radiation Oncology

## 2013-01-03 ENCOUNTER — Ambulatory Visit: Admission: RE | Admit: 2013-01-03 | Payer: Medicare Other | Source: Ambulatory Visit

## 2013-01-06 ENCOUNTER — Ambulatory Visit
Admission: RE | Admit: 2013-01-06 | Discharge: 2013-01-06 | Disposition: A | Discharge status: Home / Self Care | Payer: Medicare Other | Source: Ambulatory Visit | Attending: Radiation Oncology | Admitting: Radiation Oncology

## 2013-01-06 ENCOUNTER — Ambulatory Visit: Payer: Medicare Other

## 2013-01-06 ENCOUNTER — Encounter: Payer: Self-pay | Admitting: Radiation Oncology

## 2013-01-06 VITALS — BP 115/72 | HR 85 | Temp 98.1°F | Ht 74.0 in

## 2013-01-06 DIAGNOSIS — C20 Malignant neoplasm of rectum: Secondary | ICD-10-CM

## 2013-01-06 NOTE — Progress Notes (Signed)
  Radiation Oncology         [phone] ________________________________  Name: Alexander Johnston MRN: [id number]  Date: 12/19/2012  DOB: Feb 10, 1925  COMPLEX SIMULATION  NOTE  Diagnosis: rectal cancer  Narrative The patient has initially been planned to receive a course of radiation treatment to a dose of 45 gray in 25 fractions at 1.8 gray per fraction. The patient will now receive a boost to the high risk target volume for an additional 5.4 gray. This will be delivered in 3 fractions at 1.8 gray per fraction and a cone down boost technique will be utilized. To accomplish this, an additional 4 customized blocks have been designed for this purpose. A complex isodose plan is requested to ensure that the high-risk target region receives the appropriate radiation dose and that the nearby normal structures continue to be appropriately spared. The patient's final total dose therefore will be 50.4 gray.   ________________________________ ------------------------------------------------  Radene Gunning, MD, PhD

## 2013-01-06 NOTE — Progress Notes (Signed)
Alexander Johnston has completed radiation therapy for rectal cancer today.  He c/o rectal pain as a level 5-6 on a scale of 0-10 with itching.  He is using a cream from [employer] and Neosporin pain relief which is affording relief.  He continuing to have burning upon urination.   He states he no longer has diarrhea and will take his last dose of Xeloda tonight.  He rang the bell 28 times to celebrate completing his 28 treatments.  He is accompanied by his daughter.

## 2013-01-06 NOTE — Progress Notes (Signed)
   Department of Radiation Oncology  Phone:  [phone] Fax:        [phone]  Weekly Treatment Note    Name: Alexander Johnston Date: 01/06/2013 MRN: [id number] DOB: [birth year]   Current dose: 50.4 Gy  Current fraction: 28   MEDICATIONS: Current Outpatient Prescriptions  Medication Sig Dispense Refill  . amLODipine (NORVASC) 10 MG tablet Take 10 mg by mouth daily.      . capecitabine (XELODA) 500 MG tablet Take 3 tablets (1,500 mg total) by mouth 2 (two) times daily after a meal. On days of radiation only. (Mon-Fri)  168 tablet  0  . diltiazem (CARDIZEM CD) 240 MG 24 hr capsule Take 240 mg by mouth daily.      . hydrochlorothiazide (HYDRODIURIL) 25 MG tablet Take 25 mg by mouth daily.      Marland Kitchen HYDROcodone-acetaminophen (NORCO/VICODIN) 5-325 MG per tablet Take 1 tablet by mouth every 6 (six) hours as needed for moderate pain.  30 tablet  0  . Levothyroxine Sodium (SYNTHROID PO) Take 25 mcg by mouth daily.       Marland Kitchen loperamide (IMODIUM) 2 MG capsule Take 2 mg by mouth 2 (two) times daily.      Marland Kitchen losartan (COZAAR) 100 MG tablet Take 100 mg by mouth daily.      . Multiple Vitamin (ONE-A-DAY MENS PO) Take 1 tablet by mouth daily.      . phenylephrine (,USE FOR PREPARATION-H,) 0.25 % suppository Place 1 suppository rectally 2 (two) times daily.      . potassium chloride SA (K-DUR,KLOR-CON) 20 MEQ tablet Take 1 tablet (20 mEq total) by mouth daily.  30 tablet  2  . Rosuvastatin Calcium (CRESTOR PO) Take 1 tablet by mouth daily.      . sertraline (ZOLOFT) 25 MG tablet Take 25 mg by mouth daily.       No current facility-administered medications for this encounter.     ALLERGIES: Review of patient's allergies indicates no known allergies.   LABORATORY DATA:  Lab Results  Component Value Date   WBC 6.6 12/24/2012   HGB 11.0* 12/24/2012   HCT 33.7* 12/24/2012   MCV 84.5 12/24/2012   PLT 267 12/24/2012   Lab Results  Component Value Date   NA 137 11/19/2012   K 4.2 11/19/2012     CL 101 10/17/2012   CO2 25 11/19/2012   Lab Results  Component Value Date   ALT 8 11/19/2012   AST 13 11/19/2012   ALKPHOS 71 11/19/2012   BILITOT 0.34 11/19/2012     NARRATIVE: Alexander Johnston was seen today for weekly treatment management. The chart was checked and the patient's films were reviewed. The patient finished his final treatment today. He does complain of some irritation/itching in the rectal region. Otherwise he has done excellent. Overall he feels much better than before treatment.  PHYSICAL EXAMINATION: height is 6\' 2"  (1.88 m). His temperature is 98.1 F (36.7 C). His blood pressure is 115/72 and his pulse is 85.        ASSESSMENT: The patient satisfactorily with treatment.  PLAN: followup in one month. He will continue to use his skin cream over the next couple of weeks as needed.

## 2013-01-07 ENCOUNTER — Ambulatory Visit: Payer: Medicare Other

## 2013-01-08 ENCOUNTER — Ambulatory Visit: Payer: Medicare Other

## 2013-01-17 NOTE — Progress Notes (Signed)
  Radiation Oncology         [phone] ________________________________  Name: Alexander Johnston MRN: [id number]  Date: 01/06/2013  DOB: 10/08/1924  End of Treatment Note  Diagnosis:   Rectal cancer     Indication for treatment:  Potentially curative if patient proceeds with surgery       Radiation treatment dates:   11/27/2012 through 01/06/2013  Site/dose:   The patient was treated to the gross disease as well as the high risk pelvic nodal regions to a dose of 45 gray at 1.8 gray per fraction. This was accomplished using a 4 field 3-D conformal technique. The patient then received a cone down boost treatment using a 4 field technique for an additional 5.4 gray. The patient's total dose was 50.4 gray.  Narrative: The patient tolerated radiation treatment relatively well.   The patient had a nice symptomatic response during treatment. He did have some rectal/anal irritation at the end of treatment.  Plan: The patient has completed radiation treatment. The patient will return to radiation oncology clinic for routine followup in one month. I advised the patient to call or return sooner if they have any questions or concerns related to their recovery or treatment. ________________________________  Radene Gunning, M.D., Ph.D.

## 2013-02-25 ENCOUNTER — Ambulatory Visit (HOSPITAL_BASED_OUTPATIENT_CLINIC_OR_DEPARTMENT_OTHER): Payer: Medicare Other | Admitting: Oncology

## 2013-02-25 ENCOUNTER — Telehealth: Payer: Self-pay | Admitting: Oncology

## 2013-02-25 ENCOUNTER — Other Ambulatory Visit (HOSPITAL_BASED_OUTPATIENT_CLINIC_OR_DEPARTMENT_OTHER): Payer: Medicare Other

## 2013-02-25 ENCOUNTER — Encounter: Payer: Self-pay | Admitting: Oncology

## 2013-02-25 VITALS — BP 134/61 | HR 67 | Temp 97.7°F | Resp 18 | Ht 74.0 in | Wt 202.0 lb

## 2013-02-25 DIAGNOSIS — F329 Major depressive disorder, single episode, unspecified: Secondary | ICD-10-CM

## 2013-02-25 DIAGNOSIS — T451X5A Adverse effect of antineoplastic and immunosuppressive drugs, initial encounter: Secondary | ICD-10-CM

## 2013-02-25 DIAGNOSIS — C20 Malignant neoplasm of rectum: Secondary | ICD-10-CM

## 2013-02-25 DIAGNOSIS — D6481 Anemia due to antineoplastic chemotherapy: Secondary | ICD-10-CM

## 2013-02-25 DIAGNOSIS — R970 Elevated carcinoembryonic antigen [CEA]: Secondary | ICD-10-CM

## 2013-02-25 DIAGNOSIS — F3289 Other specified depressive episodes: Secondary | ICD-10-CM

## 2013-02-25 LAB — CBC WITH DIFFERENTIAL/PLATELET
BASO%: 1.2 % (ref 0.0–2.0)
Basophils Absolute: 0.1 10*3/uL (ref 0.0–0.1)
EOS%: 14.2 % — AB (ref 0.0–7.0)
Eosinophils Absolute: 1.1 10*3/uL — ABNORMAL HIGH (ref 0.0–0.5)
HCT: 37.7 % — ABNORMAL LOW (ref 38.4–49.9)
HEMOGLOBIN: 12.3 g/dL — AB (ref 13.0–17.1)
LYMPH#: 0.9 10*3/uL (ref 0.9–3.3)
LYMPH%: 12.1 % — ABNORMAL LOW (ref 14.0–49.0)
MCH: 29 pg (ref 27.2–33.4)
MCHC: 32.6 g/dL (ref 32.0–36.0)
MCV: 89.1 fL (ref 79.3–98.0)
MONO#: 0.8 10*3/uL (ref 0.1–0.9)
MONO%: 10.6 % (ref 0.0–14.0)
NEUT#: 4.6 10*3/uL (ref 1.5–6.5)
NEUT%: 61.9 % (ref 39.0–75.0)
Platelets: 264 10*3/uL (ref 140–400)
RBC: 4.22 10*6/uL (ref 4.20–5.82)
RDW: 20.2 % — AB (ref 11.0–14.6)
WBC: 7.5 10*3/uL (ref 4.0–10.3)

## 2013-02-25 LAB — CEA: CEA: 0.8 ng/mL (ref 0.0–5.0)

## 2013-02-25 NOTE — Telephone Encounter (Signed)
Gave pt appt for lab and Md for paril 2015, called Dr. byerly's office left message with new patient scheduler, gave pt , Dr. Barry Dienes office telephone number

## 2013-02-25 NOTE — Progress Notes (Signed)
   [facility]    OFFICE PROGRESS NOTE   INTERVAL HISTORY:   Mr. Menter returns for scheduled followup of rectal cancer. He reports resolution of rectal pain, diarrhea, and bleeding. The skin breakdown at the perineum and groin has resolved. He has depression symptoms. Good appetite.  Objective:  Vital signs in last 24 hours:  Blood pressure 134/61, pulse 67, temperature 97.7 F (36.5 C), temperature source Oral, resp. rate 18, height 6\' 2"  (1.88 m), weight 202 lb (91.627 kg), SpO2 97.00%.    HEENT: Neck without mass Lymphatics: No cervical, supraclavicular, axillary, or inguinal nodes Resp: Lungs clear bilaterally Cardio: Regular rate and rhythm GI: No hepatomegaly, nontender, no mass Vascular: Trace edema at the left greater than right lower leg  Skin: Radiation hyperpigmentation at the groin and perineum without skin breakdown. Multiple excoriations over the trunk and extremities (he reports scratching secondary to a chronic eczema type rash)     Lab Results:  Lab Results  Component Value Date   WBC 7.5 02/25/2013   HGB 12.3* 02/25/2013   HCT 37.7* 02/25/2013   MCV 89.1 02/25/2013   PLT 264 02/25/2013   NEUTROABS 4.6 02/25/2013      Medications: I have reviewed the patient's current medications.  Assessment/Plan: 1. Adenocarcinoma of the distal rectum.  CTs of the chest, abdomen and pelvis without evidence of metastatic disease.  EUS 11/07/2012 showed a uT3N0 circumferential nonobstructing 7 cm long adenocarcinoma in the rectum with distal edge 3-4 cm from the anal verge.  Initiation of concurrent radiation and Xeloda 11/27/2012, completed 01/06/2013 2. Elevated CEA. 3. Diarrhea/rectal urgency secondary to #1. Resolved. 4. History of Weight loss. 5. History of coronary artery disease. 6. Depression. 7. Multiple colon polyps on colonoscopy 10/17/2012. 8. Mild anemia secondary to rectal bleeding and chemotherapy-improved  Disposition:  He  completed treatment with chemotherapy/radiation. His symptoms related to rectal cancer have resolved. I referred him to Dr. Barry Dienes for a repeat endoscopic evaluation and further discussion regarding surgical options. Mr. Anastacio will return for an office visit here in 3 months.  I offered him a referral to the [facility] service for depression counseling. He declined.   Betsy Coder, MD  02/25/2013  11:27 AM

## 2013-02-26 ENCOUNTER — Telehealth: Payer: Self-pay | Admitting: *Deleted

## 2013-02-26 NOTE — Telephone Encounter (Signed)
Message copied by Norma Fredrickson on Wed Feb 26, 2013  3:10 PM ------      Message from: Tania Ade      Created: Wed Feb 26, 2013  2:46 PM                   ----- Message -----         From: [name], MD         Sent: 02/25/2013   4:26 PM           To: Tania Ade, RN, Ludwig Lean, RN, #            Please call patient, cea is now normal ------

## 2013-02-26 NOTE — Telephone Encounter (Signed)
Called and informed patient's daughter Anselmo Pickler) of normal cea. Per Dr. Benay Spice.  Patient's daughter verbalized understanding and stated she would inform her father.

## 2013-03-03 ENCOUNTER — Telehealth: Payer: Self-pay | Admitting: Oncology

## 2013-03-03 NOTE — Telephone Encounter (Signed)
Faxed pt medical records to [facility]

## 2013-03-14 ENCOUNTER — Emergency Department (HOSPITAL_COMMUNITY): Payer: Medicare Other

## 2013-03-14 ENCOUNTER — Encounter (HOSPITAL_COMMUNITY): Payer: Self-pay | Admitting: Emergency Medicine

## 2013-03-14 ENCOUNTER — Inpatient Hospital Stay (HOSPITAL_COMMUNITY)
Admission: EM | Admit: 2013-03-14 | Discharge: 2013-03-18 | DRG: 065 | Disposition: A | Discharge status: Rehabilitation Facility | Payer: Medicare Other | Attending: Internal Medicine | Admitting: Internal Medicine

## 2013-03-14 DIAGNOSIS — R209 Unspecified disturbances of skin sensation: Secondary | ICD-10-CM | POA: Diagnosis present

## 2013-03-14 DIAGNOSIS — C189 Malignant neoplasm of colon, unspecified: Secondary | ICD-10-CM

## 2013-03-14 DIAGNOSIS — F411 Generalized anxiety disorder: Secondary | ICD-10-CM | POA: Diagnosis present

## 2013-03-14 DIAGNOSIS — E039 Hypothyroidism, unspecified: Secondary | ICD-10-CM | POA: Diagnosis present

## 2013-03-14 DIAGNOSIS — R200 Anesthesia of skin: Secondary | ICD-10-CM

## 2013-03-14 DIAGNOSIS — I10 Essential (primary) hypertension: Secondary | ICD-10-CM | POA: Diagnosis present

## 2013-03-14 DIAGNOSIS — Z79899 Other long term (current) drug therapy: Secondary | ICD-10-CM

## 2013-03-14 DIAGNOSIS — K589 Irritable bowel syndrome without diarrhea: Secondary | ICD-10-CM | POA: Diagnosis present

## 2013-03-14 DIAGNOSIS — G8190 Hemiplegia, unspecified affecting unspecified side: Secondary | ICD-10-CM | POA: Diagnosis present

## 2013-03-14 DIAGNOSIS — I639 Cerebral infarction, unspecified: Secondary | ICD-10-CM | POA: Diagnosis present

## 2013-03-14 DIAGNOSIS — Z85038 Personal history of other malignant neoplasm of large intestine: Secondary | ICD-10-CM

## 2013-03-14 DIAGNOSIS — I519 Heart disease, unspecified: Secondary | ICD-10-CM

## 2013-03-14 DIAGNOSIS — I2510 Atherosclerotic heart disease of native coronary artery without angina pectoris: Secondary | ICD-10-CM | POA: Diagnosis present

## 2013-03-14 DIAGNOSIS — I6340 Cerebral infarction due to embolism of unspecified cerebral artery: Principal | ICD-10-CM | POA: Diagnosis present

## 2013-03-14 DIAGNOSIS — K219 Gastro-esophageal reflux disease without esophagitis: Secondary | ICD-10-CM | POA: Diagnosis present

## 2013-03-14 DIAGNOSIS — Z7982 Long term (current) use of aspirin: Secondary | ICD-10-CM

## 2013-03-14 DIAGNOSIS — Z8673 Personal history of transient ischemic attack (TIA), and cerebral infarction without residual deficits: Secondary | ICD-10-CM

## 2013-03-14 DIAGNOSIS — R269 Unspecified abnormalities of gait and mobility: Secondary | ICD-10-CM | POA: Diagnosis present

## 2013-03-14 DIAGNOSIS — R197 Diarrhea, unspecified: Secondary | ICD-10-CM | POA: Diagnosis present

## 2013-03-14 DIAGNOSIS — C20 Malignant neoplasm of rectum: Secondary | ICD-10-CM | POA: Diagnosis present

## 2013-03-14 DIAGNOSIS — E785 Hyperlipidemia, unspecified: Secondary | ICD-10-CM | POA: Diagnosis present

## 2013-03-14 HISTORY — DX: Abrasion, right lesser toe(s), initial encounter: S90.414A

## 2013-03-14 HISTORY — DX: Abrasion, right great toe, initial encounter: S90.411A

## 2013-03-14 HISTORY — DX: Abrasion, right lower leg, initial encounter: S80.811A

## 2013-03-14 LAB — DIFFERENTIAL
BASOS PCT: 1 % (ref 0–1)
Basophils Absolute: 0.1 10*3/uL (ref 0.0–0.1)
EOS ABS: 0.6 10*3/uL (ref 0.0–0.7)
EOS PCT: 9 % — AB (ref 0–5)
Lymphocytes Relative: 14 % (ref 12–46)
Lymphs Abs: 0.9 10*3/uL (ref 0.7–4.0)
Monocytes Absolute: 1 10*3/uL (ref 0.1–1.0)
Monocytes Relative: 15 % — ABNORMAL HIGH (ref 3–12)
NEUTROS PCT: 61 % (ref 43–77)
Neutro Abs: 3.9 10*3/uL (ref 1.7–7.7)

## 2013-03-14 LAB — POCT I-STAT TROPONIN I: TROPONIN I, POC: 0 ng/mL (ref 0.00–0.08)

## 2013-03-14 LAB — POCT I-STAT, CHEM 8
BUN: 15 mg/dL (ref 6–23)
CHLORIDE: 102 meq/L (ref 96–112)
Calcium, Ion: 1.2 mmol/L (ref 1.13–1.30)
Creatinine, Ser: 0.9 mg/dL (ref 0.50–1.35)
Glucose, Bld: 115 mg/dL — ABNORMAL HIGH (ref 70–99)
HEMATOCRIT: 42 % (ref 39.0–52.0)
Hemoglobin: 14.3 g/dL (ref 13.0–17.0)
POTASSIUM: 3.4 meq/L — AB (ref 3.7–5.3)
Sodium: 140 mEq/L (ref 137–147)
TCO2: 24 mmol/L (ref 0–100)

## 2013-03-14 LAB — URINALYSIS, ROUTINE W REFLEX MICROSCOPIC
Bilirubin Urine: NEGATIVE
GLUCOSE, UA: NEGATIVE mg/dL
Hgb urine dipstick: NEGATIVE
KETONES UR: NEGATIVE mg/dL
LEUKOCYTES UA: NEGATIVE
Nitrite: NEGATIVE
Protein, ur: NEGATIVE mg/dL
Specific Gravity, Urine: 1.012 (ref 1.005–1.030)
Urobilinogen, UA: 0.2 mg/dL (ref 0.0–1.0)
pH: 5.5 (ref 5.0–8.0)

## 2013-03-14 LAB — CBC
HCT: 38.2 % — ABNORMAL LOW (ref 39.0–52.0)
Hemoglobin: 12.8 g/dL — ABNORMAL LOW (ref 13.0–17.0)
MCH: 29.2 pg (ref 26.0–34.0)
MCHC: 33.5 g/dL (ref 30.0–36.0)
MCV: 87 fL (ref 78.0–100.0)
PLATELETS: 267 10*3/uL (ref 150–400)
RBC: 4.39 MIL/uL (ref 4.22–5.81)
RDW: 16.6 % — ABNORMAL HIGH (ref 11.5–15.5)
WBC: 6.4 10*3/uL (ref 4.0–10.5)

## 2013-03-14 LAB — COMPREHENSIVE METABOLIC PANEL
ALBUMIN: 3.2 g/dL — AB (ref 3.5–5.2)
ALT: 10 U/L (ref 0–53)
AST: 19 U/L (ref 0–37)
Alkaline Phosphatase: 73 U/L (ref 39–117)
BUN: 16 mg/dL (ref 6–23)
CO2: 24 mEq/L (ref 19–32)
Calcium: 8.8 mg/dL (ref 8.4–10.5)
Chloride: 99 mEq/L (ref 96–112)
Creatinine, Ser: 0.86 mg/dL (ref 0.50–1.35)
GFR calc non Af Amer: 76 mL/min — ABNORMAL LOW (ref >90)
GFR, EST AFRICAN AMERICAN: 88 mL/min — AB (ref >90)
Glucose, Bld: 120 mg/dL — ABNORMAL HIGH (ref 70–99)
POTASSIUM: 3.5 meq/L — AB (ref 3.7–5.3)
SODIUM: 136 meq/L — AB (ref 137–147)
TOTAL PROTEIN: 6.4 g/dL (ref 6.0–8.3)
Total Bilirubin: 0.3 mg/dL (ref 0.3–1.2)

## 2013-03-14 LAB — RAPID URINE DRUG SCREEN, HOSP PERFORMED
AMPHETAMINES: NOT DETECTED
Barbiturates: NOT DETECTED
Benzodiazepines: NOT DETECTED
Cocaine: NOT DETECTED
Opiates: NOT DETECTED
Tetrahydrocannabinol: NOT DETECTED

## 2013-03-14 LAB — TROPONIN I

## 2013-03-14 LAB — APTT: aPTT: 44 seconds — ABNORMAL HIGH (ref 24–37)

## 2013-03-14 LAB — PROTIME-INR
INR: 0.99 (ref 0.00–1.49)
PROTHROMBIN TIME: 12.9 s (ref 11.6–15.2)

## 2013-03-14 NOTE — ED Notes (Signed)
Pt given stroke swallow screen and passed without any problems.  Pt resting at this time with no complaints.  Family at bedside.  Waiting for neurologist to see pt.

## 2013-03-14 NOTE — ED Provider Notes (Signed)
CSN: [id number]     Arrival date & time 03/14/13  1958 History   First MD Initiated Contact with Patient 03/14/13 2019     Chief Complaint  Patient presents with  . Numbness   (Consider location/radiation/quality/duration/timing/severity/associated sxs/prior Treatment) Patient is a 80 y.o. male presenting with Acute Neurological Problem. The history is provided by the patient.  Cerebrovascular Accident This is a recurrent problem. The current episode started today. The problem occurs constantly. The problem has been unchanged. Associated symptoms include fatigue, numbness and weakness. Pertinent negatives include no abdominal pain, arthralgias, change in bowel habit, chest pain, chills, congestion, fever, headaches, joint swelling, myalgias, nausea, neck pain, rash, swollen glands, urinary symptoms or vomiting. Nothing aggravates the symptoms. He has tried nothing for the symptoms. The treatment provided no relief.    80 year old male with history of prior CVA coronary disease colon cancer comes in with a chief complaint disoriented feeling and left lower extremity tingling and left hand tingling.  Patient had a similar experience happened last week. Patient did not seek any medical attention and it resolved on film. Visual is concerned that episode was a TIA. Patient with a stroke in the past with no residual deficits. Patient recently completed radiation chemotherapy for rectal carcinoma. Patient with next therapy possible colonoscopy with surgical removal. Patient denies any chest pain shortness of breath weakness and inability to walk.   Past Medical History  Diagnosis Date  . CAD (coronary artery disease)   . Hypertension   . Stroke   . GERD (gastroesophageal reflux disease)   . Diarrhea     intermittent past 4 years  . Colon cancer 10/17/12    adenocarcinoma  . IBS (irritable bowel syndrome)   . Hypothyroidism    Past Surgical History  Procedure Laterality Date  . Knee surgery     bilateral  . Colonoscopy w/ biopsies  10/17/12    rectosigmoid=adenocarcinoma  . Eus N/A 11/07/2012    Procedure: LOWER ENDOSCOPIC ULTRASOUND (EUS);  Surgeon: Milus Banister, MD;  Location: [facility];  Service: Endoscopy;  Laterality: N/A;   Family History  Problem Relation Age of Onset  . Heart disease Mother     "all her family"  . Diabetes Brother   . Hypertension Brother   . Cancer Daughter     choriocarricinoma   History  Substance Use Topics  . Smoking status: Never Smoker   . Smokeless tobacco: Never Used  . Alcohol Use: No    Review of Systems  Constitutional: Positive for fatigue. Negative for fever and chills.  HENT: Negative for congestion and facial swelling.   Eyes: Negative for discharge and visual disturbance.  Respiratory: Negative for shortness of breath.   Cardiovascular: Negative for chest pain and palpitations.  Gastrointestinal: Negative for nausea, vomiting, abdominal pain, diarrhea and change in bowel habit.  Musculoskeletal: Negative for arthralgias, joint swelling, myalgias and neck pain.  Skin: Negative for color change and rash.  Neurological: Positive for weakness and numbness. Negative for tremors, syncope and headaches.       Unsteadiness while walking  Psychiatric/Behavioral: Negative for confusion and dysphoric mood.    Allergies  Review of patient's allergies indicates no known allergies.  Home Medications   Current Outpatient Rx  Name  Route  Sig  Dispense  Refill  . amLODipine (NORVASC) 10 MG tablet   Oral   Take 10 mg by mouth daily.         Marland Kitchen aspirin 81 MG chewable tablet   Oral  Chew 162 mg by mouth once.         . diltiazem (CARDIZEM CD) 240 MG 24 hr capsule   Oral   Take 240 mg by mouth daily.         . hydrochlorothiazide (HYDRODIURIL) 25 MG tablet   Oral   Take 25 mg by mouth daily.         . Levothyroxine Sodium (SYNTHROID PO)   Oral   Take 25 mcg by mouth daily.          Marland Kitchen loperamide (IMODIUM) 2 MG  capsule   Oral   Take 2 mg by mouth 2 (two) times daily.         Marland Kitchen losartan (COZAAR) 100 MG tablet   Oral   Take 100 mg by mouth daily.         Marland Kitchen METOPROLOL TARTRATE PO   Oral   Take 1 tablet by mouth daily.         . Multiple Vitamin (ONE-A-DAY MENS PO)   Oral   Take 1 tablet by mouth daily.         . nitroGLYCERIN (NITROSTAT) 0.4 MG SL tablet   Sublingual   Place 0.4 mg under the tongue every 5 (five) minutes as needed for chest pain.         Marland Kitchen OVER THE COUNTER MEDICATION   Oral   Take 2-3 sprays by mouth daily as needed ("essential oils" spray).         . Rosuvastatin Calcium (CRESTOR PO)   Oral   Take 1 tablet by mouth daily.         . sertraline (ZOLOFT) 25 MG tablet   Oral   Take 25 mg by mouth daily.          BP 157/72  Pulse 62  Temp(Src) 97.9 F (36.6 C) (Oral)  Resp 21  SpO2 99% Physical Exam  Constitutional: He is oriented to person, place, and time. He appears well-developed and well-nourished.  HENT:  Head: Normocephalic and atraumatic.  Eyes: EOM are normal. Pupils are equal, round, and reactive to light.  Neck: Normal range of motion. Neck supple. No JVD present.  Cardiovascular: Normal rate and regular rhythm.  Exam reveals no gallop and no friction rub.   No murmur heard. Pulmonary/Chest: No respiratory distress. He has no wheezes.  Abdominal: He exhibits no distension. There is no rebound and no guarding.  Musculoskeletal: Normal range of motion.  Neurological: He is alert and oriented to person, place, and time. He has normal strength. No cranial nerve deficit or sensory deficit. Coordination normal. GCS eye subscore is 4. GCS verbal subscore is 5. GCS motor subscore is 6. He displays no Babinski's sign on the right side. He displays no Babinski's sign on the left side.  Reflex Scores:      Tricep reflexes are 2+ on the right side and 2+ on the left side.      Bicep reflexes are 2+ on the right side and 2+ on the left side.       Brachioradialis reflexes are 2+ on the right side and 2+ on the left side.      Patellar reflexes are 2+ on the right side and 2+ on the left side.      Achilles reflexes are 2+ on the right side and 2+ on the left side. Skin: No rash noted. No pallor.  Psychiatric: He has a normal mood and affect. His behavior is normal.  ED Course  Procedures (including critical care time) Labs Review Labs Reviewed  APTT - Abnormal; Notable for the following:    aPTT 44 (*)    All other components within normal limits  CBC - Abnormal; Notable for the following:    Hemoglobin 12.8 (*)    HCT 38.2 (*)    RDW 16.6 (*)    All other components within normal limits  DIFFERENTIAL - Abnormal; Notable for the following:    Monocytes Relative 15 (*)    Eosinophils Relative 9 (*)    All other components within normal limits  COMPREHENSIVE METABOLIC PANEL - Abnormal; Notable for the following:    Sodium 136 (*)    Potassium 3.5 (*)    Glucose, Bld 120 (*)    Albumin 3.2 (*)    GFR calc non Af Amer 76 (*)    GFR calc Af Amer 88 (*)    All other components within normal limits  POCT I-STAT, CHEM 8 - Abnormal; Notable for the following:    Potassium 3.4 (*)    Glucose, Bld 115 (*)    All other components within normal limits  PROTIME-INR  TROPONIN I  URINALYSIS, ROUTINE W REFLEX MICROSCOPIC  URINE RAPID DRUG SCREEN (HOSP PERFORMED)  ETHANOL  POCT I-STAT TROPONIN I   Imaging Review Ct Head Wo Contrast  03/14/2013   CLINICAL DATA:  Left-sided arm pain weakness  EXAM: CT HEAD WITHOUT CONTRAST  TECHNIQUE: Contiguous axial images were obtained from the base of the skull through the vertex without intravenous contrast.  COMPARISON:  None.  FINDINGS: No acute intracranial hemorrhage. No focal mass lesion. No CT evidence of acute infarction. No midline shift or mass effect. No hydrocephalus. Basilar cisterns are patent.  There is a generalized cortical atrophy and proportional ventricular dilatation. There  is mild periventricular subcortical white matter hypodensities. Paranasal sinuses and mastoid air cells are clear. Marland Kitchen  IMPRESSION: 1. No acute intracranial findings. 2. Atrophy and microvascular disease.   Electronically Signed   By: Suzy Bouchard M.D.   On: 03/14/2013 21:38    EKG Interpretation   None       MDM   1. Numbness of left hand   2. Left leg numbness     80 year old male left-sided tingling and numbness to his left hand and left lower extremity. Patient also with a feeling of disorientation. CT the head negative, workup otherwise unremarkable, including UA urine drug screen, troponin.  neurology consult.    Care transferred to Dr. Marnette Burgess. Case discussed in detail.   Deno Etienne, MD 03/15/13 [phone]

## 2013-03-14 NOTE — ED Notes (Addendum)
Pt reports to the ED via [facility] for eval of left arm . Pt reports that 2 days ago he started some on the left side. Pt denies any weakness at that time. Pt reports he took a nap today and reports that the tingling was increased upon awakening. Stroke screen en route was negative. Gait steady and speech clear. 4 lead en route showed NSR. Pt slightly hypertensive en route at 182/86. Pt A&Ox4, resp e/u, and skin warm and dry.

## 2013-03-15 ENCOUNTER — Observation Stay (HOSPITAL_COMMUNITY): Payer: Medicare Other

## 2013-03-15 DIAGNOSIS — R197 Diarrhea, unspecified: Secondary | ICD-10-CM

## 2013-03-15 DIAGNOSIS — R209 Unspecified disturbances of skin sensation: Secondary | ICD-10-CM

## 2013-03-15 DIAGNOSIS — I10 Essential (primary) hypertension: Secondary | ICD-10-CM

## 2013-03-15 DIAGNOSIS — G459 Transient cerebral ischemic attack, unspecified: Secondary | ICD-10-CM | POA: Insufficient documentation

## 2013-03-15 DIAGNOSIS — I379 Nonrheumatic pulmonary valve disorder, unspecified: Secondary | ICD-10-CM

## 2013-03-15 DIAGNOSIS — I639 Cerebral infarction, unspecified: Secondary | ICD-10-CM | POA: Diagnosis present

## 2013-03-15 DIAGNOSIS — I519 Heart disease, unspecified: Secondary | ICD-10-CM

## 2013-03-15 DIAGNOSIS — C189 Malignant neoplasm of colon, unspecified: Secondary | ICD-10-CM

## 2013-03-15 LAB — COMPREHENSIVE METABOLIC PANEL
ALK PHOS: 68 U/L (ref 39–117)
ALT: 10 U/L (ref 0–53)
AST: 18 U/L (ref 0–37)
Albumin: 3.2 g/dL — ABNORMAL LOW (ref 3.5–5.2)
BUN: 15 mg/dL (ref 6–23)
CALCIUM: 8.7 mg/dL (ref 8.4–10.5)
CO2: 23 mEq/L (ref 19–32)
Chloride: 102 mEq/L (ref 96–112)
Creatinine, Ser: 0.76 mg/dL (ref 0.50–1.35)
GFR calc Af Amer: >90 mL/min (ref >90)
GFR calc non Af Amer: 80 mL/min — ABNORMAL LOW (ref >90)
Glucose, Bld: 101 mg/dL — ABNORMAL HIGH (ref 70–99)
Potassium: 3.5 mEq/L — ABNORMAL LOW (ref 3.7–5.3)
Sodium: 139 mEq/L (ref 137–147)
TOTAL PROTEIN: 6.2 g/dL (ref 6.0–8.3)
Total Bilirubin: 0.4 mg/dL (ref 0.3–1.2)

## 2013-03-15 LAB — CBC WITH DIFFERENTIAL/PLATELET
BASOS ABS: 0.1 10*3/uL (ref 0.0–0.1)
Basophils Relative: 1 % (ref 0–1)
EOS ABS: 0.6 10*3/uL (ref 0.0–0.7)
EOS PCT: 7 % — AB (ref 0–5)
HCT: 37.3 % — ABNORMAL LOW (ref 39.0–52.0)
Hemoglobin: 12.6 g/dL — ABNORMAL LOW (ref 13.0–17.0)
LYMPHS ABS: 1 10*3/uL (ref 0.7–4.0)
LYMPHS PCT: 12 % (ref 12–46)
MCH: 29.2 pg (ref 26.0–34.0)
MCHC: 33.8 g/dL (ref 30.0–36.0)
MCV: 86.5 fL (ref 78.0–100.0)
Monocytes Absolute: 0.9 10*3/uL (ref 0.1–1.0)
Monocytes Relative: 10 % (ref 3–12)
Neutro Abs: 6.2 10*3/uL (ref 1.7–7.7)
Neutrophils Relative %: 71 % (ref 43–77)
PLATELETS: 270 10*3/uL (ref 150–400)
RBC: 4.31 MIL/uL (ref 4.22–5.81)
RDW: 16.5 % — AB (ref 11.5–15.5)
WBC: 8.8 10*3/uL (ref 4.0–10.5)

## 2013-03-15 LAB — RAPID URINE DRUG SCREEN, HOSP PERFORMED
Amphetamines: NOT DETECTED
Barbiturates: NOT DETECTED
Benzodiazepines: NOT DETECTED
Cocaine: NOT DETECTED
Opiates: NOT DETECTED
Tetrahydrocannabinol: NOT DETECTED

## 2013-03-15 LAB — ETHANOL

## 2013-03-15 LAB — LIPID PANEL
CHOLESTEROL: 140 mg/dL (ref 0–200)
HDL: 36 mg/dL — ABNORMAL LOW (ref >39)
LDL CALC: 86 mg/dL (ref 0–99)
TRIGLYCERIDES: 89 mg/dL (ref <150)
Total CHOL/HDL Ratio: 3.9 RATIO
VLDL: 18 mg/dL (ref 0–40)

## 2013-03-15 LAB — GLUCOSE, CAPILLARY
Glucose-Capillary: 98 mg/dL (ref 70–99)
Glucose-Capillary: 102 mg/dL — ABNORMAL HIGH (ref 70–99)
Glucose-Capillary: 106 mg/dL — ABNORMAL HIGH (ref 70–99)
Glucose-Capillary: 89 mg/dL (ref 70–99)

## 2013-03-15 LAB — HEMOGLOBIN A1C
Hgb A1c MFr Bld: 5.7 % — ABNORMAL HIGH (ref <5.7)
Mean Plasma Glucose: 117 mg/dL — ABNORMAL HIGH (ref <117)

## 2013-03-15 MED ORDER — POTASSIUM CHLORIDE CRYS ER 20 MEQ PO TBCR
40.0000 meq | EXTENDED_RELEASE_TABLET | Freq: Once | ORAL | Status: AC
  Administered 2013-03-15: 40 meq via ORAL
  Filled 2013-03-15: qty 2

## 2013-03-15 MED ORDER — NITROGLYCERIN 0.4 MG SL SUBL: 0.4000 mg | SUBLINGUAL_TABLET | SUBLINGUAL | Status: DC | PRN

## 2013-03-15 MED ORDER — ENOXAPARIN SODIUM 40 MG/0.4ML ~~LOC~~ SOLN
40.0000 mg | SUBCUTANEOUS | Status: DC
  Administered 2013-03-15 – 2013-03-18 (×5): 40 mg via SUBCUTANEOUS
  Filled 2013-03-15 (×4): qty 0.4

## 2013-03-15 MED ORDER — LEVOTHYROXINE SODIUM 25 MCG PO TABS
25.0000 ug | ORAL_TABLET | Freq: Every day | ORAL | Status: DC
  Administered 2013-03-15 – 2013-03-18 (×4): 25 ug via ORAL
  Filled 2013-03-15 (×5): qty 1

## 2013-03-15 MED ORDER — SERTRALINE HCL 25 MG PO TABS
25.0000 mg | ORAL_TABLET | Freq: Every day | ORAL | Status: DC
  Administered 2013-03-15 – 2013-03-18 (×5): 25 mg via ORAL
  Filled 2013-03-15 (×4): qty 1

## 2013-03-15 MED ORDER — ASPIRIN 325 MG PO TABS
325.0000 mg | ORAL_TABLET | Freq: Every day | ORAL | Status: DC
  Administered 2013-03-15 – 2013-03-18 (×4): 325 mg via ORAL
  Filled 2013-03-15 (×5): qty 1

## 2013-03-15 MED ORDER — LOSARTAN POTASSIUM 50 MG PO TABS
100.0000 mg | ORAL_TABLET | Freq: Every day | ORAL | Status: DC
  Administered 2013-03-15 – 2013-03-18 (×4): 100 mg via ORAL
  Filled 2013-03-15 (×4): qty 2

## 2013-03-15 MED ORDER — AMLODIPINE BESYLATE 10 MG PO TABS
10.0000 mg | ORAL_TABLET | Freq: Every day | ORAL | Status: DC
  Administered 2013-03-15 – 2013-03-18 (×4): 10 mg via ORAL
  Filled 2013-03-15 (×4): qty 1

## 2013-03-15 MED ORDER — DILTIAZEM HCL ER COATED BEADS 240 MG PO CP24
240.0000 mg | ORAL_CAPSULE | Freq: Every day | ORAL | Status: DC
  Administered 2013-03-15 – 2013-03-18 (×4): 240 mg via ORAL
  Filled 2013-03-15 (×4): qty 1

## 2013-03-15 MED ORDER — ROSUVASTATIN CALCIUM 20 MG PO TABS
20.0000 mg | ORAL_TABLET | Freq: Every day | ORAL | Status: DC
  Administered 2013-03-15 – 2013-03-18 (×4): 20 mg via ORAL
  Filled 2013-03-15 (×4): qty 1

## 2013-03-15 NOTE — Progress Notes (Signed)
  Echocardiogram 2D Echocardiogram has been performed.  Alexander Johnston 03/15/2013, 4:45 PM

## 2013-03-15 NOTE — Progress Notes (Signed)
I have seen and assessed the patient and agree with Dr Serita Grit assessment and plan. Clinical improvement. MRI and 2 d echo pending. Neurology ff. Continue ASA, statin. PT/OT pending.

## 2013-03-15 NOTE — Progress Notes (Signed)
Utilization Review Completed.   Bracken Moffa, RN, BSN Nurse Case Manager  

## 2013-03-15 NOTE — Consult Note (Signed)
Referring Physician: Marnette Burgess    Chief Complaint: Difficulty with balance and left hand numbness  HPI: Alexander Johnston is an 80 y.o. male who lives alone.  Two days ago had an episode of left sided numbness.  Symptoms resolved on their own and he did not mention it to his daughter until the next. He seemed to be doing well that day and was even able to drive.  On yesterday the patient was well and took a nap at 1PM.  When he awakened he was lightheaded and off balance.  He also again noted numbness in his left arm.  His daughter came to the house to check on him and he had burned a rag cooking.  She also noted that he had fallen.  He was also having difficulty with language which is not usual for him.  EMS was called and the patient was brought here for evaluation.    Date last known well: Date: 03/14/2013 Time last known well: Time: 13:00 tPA Given: No: Outside time window  Past Medical History  Diagnosis Date  . CAD (coronary artery disease)   . Hypertension   . Stroke   . GERD (gastroesophageal reflux disease)   . Diarrhea     intermittent past 4 years  . Colon cancer 10/17/12    adenocarcinoma  . IBS (irritable bowel syndrome)   . Hypothyroidism     Past Surgical History  Procedure Laterality Date  . Knee surgery      bilateral  . Colonoscopy w/ biopsies  10/17/12    rectosigmoid=adenocarcinoma  . Eus N/A 11/07/2012    Procedure: LOWER ENDOSCOPIC ULTRASOUND (EUS);  Surgeon: Milus Banister, MD;  Location: [facility];  Service: Endoscopy;  Laterality: N/A;    Family History  Problem Relation Age of Onset  . Heart disease Mother     "all her family"  . Diabetes Brother   . Hypertension Brother   . Cancer Daughter     choriocarricinoma   Social History:  reports that he has never smoked. He has never used smokeless tobacco. He reports that he does not drink alcohol or use illicit drugs.  Allergies: No Known Allergies  Medications: I have reviewed the patient's current  medications. Prior to Admission:  Current outpatient prescriptions: amLODipine (NORVASC) 10 MG tablet, Take 10 mg by mouth daily., Disp: , Rfl: ;   aspirin 81 MG chewable tablet, Chew 162 mg by mouth once., Disp: , Rfl: ;   diltiazem (CARDIZEM CD) 240 MG 24 hr capsule, Take 240 mg by mouth daily., Disp: , Rfl: ;   hydrochlorothiazide (HYDRODIURIL) 25 MG tablet, Take 25 mg by mouth daily., Disp: , Rfl: ;  Levothyroxine Sodium (SYNTHROID PO), Take 25 mcg by mouth daily. , Disp: , Rfl:  loperamide (IMODIUM) 2 MG capsule, Take 2 mg by mouth 2 (two) times daily., Disp: , Rfl: ;   losartan (COZAAR) 100 MG tablet, Take 100 mg by mouth daily., Disp: , Rfl: ;   METOPROLOL TARTRATE PO, Take 1 tablet by mouth daily., Disp: , Rfl: ;   Multiple Vitamin (ONE-A-DAY MENS PO), Take 1 tablet by mouth daily., Disp: , Rfl:  nitroGLYCERIN (NITROSTAT) 0.4 MG SL tablet, Place 0.4 mg under the tongue every 5 (five) minutes as needed for chest pain., Disp: , Rfl: ;  OVER THE COUNTER MEDICATION, Take 2-3 sprays by mouth daily as needed ("essential oils" spray)., Disp: , Rfl: ;   Rosuvastatin Calcium (CRESTOR PO), Take 1 tablet by mouth daily., Disp: ,  Rfl: ;   sertraline (ZOLOFT) 25 MG tablet, Take 25 mg by mouth daily., Disp: , Rfl:   ROS: History obtained from the patient  General ROS: negative for - chills, fatigue, fever, night sweats, weight gain or weight loss Psychological ROS: poor reasoning at times Ophthalmic ROS: negative for - blurry vision, double vision, eye pain or loss of vision ENT ROS: HOH Allergy and Immunology ROS: negative for - hives or itchy/watery eyes Hematological and Lymphatic ROS: negative for - bleeding problems, bruising or swollen lymph nodes Endocrine ROS: negative for - galactorrhea, hair pattern changes, polydipsia/polyuria or temperature intolerance Respiratory ROS: negative for - cough, hemoptysis, shortness of breath or wheezing Cardiovascular ROS: negative for - chest pain,  dyspnea on exertion, edema or irregular heartbeat Gastrointestinal ROS: negative for - abdominal pain, diarrhea, hematemesis, nausea/vomiting or stool incontinence Genito-Urinary ROS: negative for - dysuria, hematuria, incontinence or urinary frequency/urgency Musculoskeletal ROS: negative for - joint swelling or muscular weakness Neurological ROS: as noted in HPI Dermatological ROS: eczema, scratches on back, bleeding sore on toes of right foot  Physical Examination: Blood pressure 157/72, pulse 62, temperature 97.9 F (36.6 C), temperature source Oral, resp. rate 21, SpO2 99.00%.  Neurologic Examination: Mental Status: Alert, oriented.  Repeats himself frequently.  Has some difficulty with word finding.  Fluent..  Able to follow 3 step commands without difficulty. Cranial Nerves: II: Discs flat bilaterally; Visual fields grossly normal, pupils equal, round, reactive to light and accommodation III,IV, VI: ptosis not present, extra-ocular motions intact bilaterally V,VII: smile symmetric, facial light touch sensation normal bilaterally VIII: hearing decreased bilaterally IX,X: gag reflex present XI: bilateral shoulder shrug XII: midline tongue extension Motor: Right : Upper extremity   5/5    Left:     Upper extremity   5/5 with pronator drift  Lower extremity   5/5     Lower extremity   5/5 Tone and bulk:normal tone throughout; no atrophy noted Sensory: Pinprick and light touch decreased in the left upper extremity Deep Tendon Reflexes: 2+ in the upper extremities, absent in the lower extremities.   Plantars: Right: mute   Left: mute Cerebellar: Mild dysmetria with finger-to-nose and heel-to-shin testing on the left Gait: Unable to test safely CV: pulses palpable throughout     Laboratory Studies:  Basic Metabolic Panel:  Recent Labs Lab 03/14/13 2016 03/14/13 2022  NA 136* 140  K 3.5* 3.4*  CL 99 102  CO2 24  --   GLUCOSE 120* 115*  BUN 16 15  CREATININE 0.86 0.90   CALCIUM 8.8  --     Liver Function Tests:  Recent Labs Lab 03/14/13 2016  AST 19  ALT 10  ALKPHOS 73  BILITOT 0.3  PROT 6.4  ALBUMIN 3.2*   No results found for this basename: LIPASE, AMYLASE,  in the last 168 hours No results found for this basename: AMMONIA,  in the last 168 hours  CBC:  Recent Labs Lab 03/14/13 2016 03/14/13 2022  WBC 6.4  --   NEUTROABS 3.9  --   HGB 12.8* 14.3  HCT 38.2* 42.0  MCV 87.0  --   PLT 267  --     Cardiac Enzymes:  Recent Labs Lab 03/14/13 2016  TROPONINI <0.30    BNP: No components found with this basename: POCBNP,   CBG: No results found for this basename: GLUCAP,  in the last 168 hours  Microbiology: Results for orders placed during the hospital encounter of 12/20/12  URINE CULTURE  Status: None   Collection Time    12/20/12  4:37 PM      Result Value Range Status   Urine Culture, Routine Culture, Urine   Final   Comment: Final - ===== COLONY COUNT: =====     NO GROWTH     NO GROWTH    Coagulation Studies:  Recent Labs  03/14/13 2016  LABPROT 12.9  INR 0.99    Urinalysis:  Recent Labs Lab 03/14/13 2144  COLORURINE YELLOW  LABSPEC 1.012  PHURINE 5.5  GLUCOSEU NEGATIVE  HGBUR NEGATIVE  BILIRUBINUR NEGATIVE  KETONESUR NEGATIVE  PROTEINUR NEGATIVE  UROBILINOGEN 0.2  NITRITE NEGATIVE  LEUKOCYTESUR NEGATIVE    Lipid Panel: No results found for this basename: chol, trig, hdl, cholhdl, vldl, ldlcalc    HgbA1C:  No results found for this basename: HGBA1C    Urine Drug Screen:     Component Value Date/Time   LABOPIA NONE DETECTED 03/14/2013 2144   COCAINSCRNUR NONE DETECTED 03/14/2013 2144   LABBENZ NONE DETECTED 03/14/2013 2144   AMPHETMU NONE DETECTED 03/14/2013 2144   THCU NONE DETECTED 03/14/2013 2144   LABBARB NONE DETECTED 03/14/2013 2144    Alcohol Level: No results found for this basename: ETH,  in the last 168 hours  Other results: EKG:  sinus rhythm at 73 bpm, prolonged PR  interval.  Imaging: Ct Head Wo Contrast  03/14/2013   CLINICAL DATA:  Left-sided arm pain weakness  EXAM: CT HEAD WITHOUT CONTRAST  TECHNIQUE: Contiguous axial images were obtained from the base of the skull through the vertex without intravenous contrast.  COMPARISON:  None.  FINDINGS: No acute intracranial hemorrhage. No focal mass lesion. No CT evidence of acute infarction. No midline shift or mass effect. No hydrocephalus. Basilar cisterns are patent.  There is a generalized cortical atrophy and proportional ventricular dilatation. There is mild periventricular subcortical white matter hypodensities. Paranasal sinuses and mastoid air cells are clear. Marland Kitchen  IMPRESSION: 1. No acute intracranial findings. 2. Atrophy and microvascular disease.   Electronically Signed   By: Suzy Bouchard M.D.   On: 03/14/2013 21:38    Assessment: 80 y.o. male presenting with complaints of imbalance, mild left sided weakness and left dysmetria.  Head CT reviewed and show no acute changes.  Acute infarct suspected.  Further work up recommended.    Stroke Risk Factors - hypertension  Plan: 1. HgbA1c, fasting lipid panel 2. MRI, MRA  of the brain without contrast 3. PT consult, OT consult, Speech consult 4. Echocardiogram 5. Carotid dopplers 6. Prophylactic therapy-Antiplatelet med: Aspirin - dose 325mg  daily 7. Risk factor modification 8. Telemetry monitoring 9. Frequent neuro checks   Alexis Goodell, MD [facility] [phone] 03/15/2013, 2:17 AM

## 2013-03-15 NOTE — H&P (Signed)
[facility] History and Physical  Patient: Alexander Johnston  WNU:[id number]  DOB: Oct 30, 1924  DOS: the patient was seen and examined on 03/15/2013 PCP: [name], MD  Chief Complaint: Left sided numbness  HPI: Alexander Johnston is a 80 y.o. male with Past medical history of coronary artery disease, hypertension, GERD, TIA, hypothyroidism, colon cancer. The patient is coming from home. The patient presented with complain of left-sided numbness. He mentions the symptoms started at around 3:00 when he woke from sleep in the afternoon. He mentions that he had similar symptoms which resolved on its own on Wednesday when he was driving to see his wife. He denies any similar episodes in past, denies any trauma or fall, denies any syncopal episode, denies any visual disturbances. Pt denies any fever, chills, headache, cough, chest pain, palpitation, shortness of breath, orthopnea, PND, nausea, vomiting, abdominal pain, diarrhea, constipation, active bleeding, burning urination, dizziness, pedal edema, other focal neurological deficit.   Review of Systems: as mentioned in the history of present illness.  A Comprehensive review of the other systems is negative.  Past Medical History  Diagnosis Date  . CAD (coronary artery disease)   . Hypertension   . Stroke   . GERD (gastroesophageal reflux disease)   . Diarrhea     intermittent past 4 years  . Colon cancer 10/17/12    adenocarcinoma  . IBS (irritable bowel syndrome)   . Hypothyroidism    Past Surgical History  Procedure Laterality Date  . Knee surgery      bilateral  . Colonoscopy w/ biopsies  10/17/12    rectosigmoid=adenocarcinoma  . Eus N/A 11/07/2012    Procedure: LOWER ENDOSCOPIC ULTRASOUND (EUS);  Surgeon: Milus Banister, MD;  Location: [facility];  Service: Endoscopy;  Laterality: N/A;   Social History:  reports that he has never smoked. He has never used smokeless tobacco. He reports that he does not drink alcohol or use  illicit drugs. Independent for most of his  ADL.  No Known Allergies  Family History  Problem Relation Age of Onset  . Heart disease Mother     "all her family"  . Diabetes Brother   . Hypertension Brother   . Cancer Daughter     choriocarricinoma    Prior to Admission medications   Medication Sig Start Date End Date Taking? Authorizing Provider  amLODipine (NORVASC) 10 MG tablet Take 10 mg by mouth daily.   Yes Historical Provider, MD  aspirin 81 MG chewable tablet Chew 162 mg by mouth once.   Yes Historical Provider, MD  diltiazem (CARDIZEM CD) 240 MG 24 hr capsule Take 240 mg by mouth daily.   Yes Historical Provider, MD  hydrochlorothiazide (HYDRODIURIL) 25 MG tablet Take 25 mg by mouth daily.   Yes Historical Provider, MD  Levothyroxine Sodium (SYNTHROID PO) Take 25 mcg by mouth daily.    Yes Historical Provider, MD  loperamide (IMODIUM) 2 MG capsule Take 2 mg by mouth 2 (two) times daily.   Yes Historical Provider, MD  losartan (COZAAR) 100 MG tablet Take 100 mg by mouth daily.   Yes Historical Provider, MD  METOPROLOL TARTRATE PO Take 1 tablet by mouth daily.   Yes Historical Provider, MD  Multiple Vitamin (ONE-A-DAY MENS PO) Take 1 tablet by mouth daily.   Yes Historical Provider, MD  nitroGLYCERIN (NITROSTAT) 0.4 MG SL tablet Place 0.4 mg under the tongue every 5 (five) minutes as needed for chest pain.   Yes Historical Provider, MD  OVER  THE COUNTER MEDICATION Take 2-3 sprays by mouth daily as needed ("essential oils" spray).   Yes Historical Provider, MD  Rosuvastatin Calcium (CRESTOR PO) Take 1 tablet by mouth daily.   Yes Historical Provider, MD  sertraline (ZOLOFT) 25 MG tablet Take 25 mg by mouth daily.   Yes Historical Provider, MD    Physical Exam: Filed Vitals:   03/14/13 2343 03/14/13 2345 03/15/13 0000 03/15/13 0245  BP:  133/77 157/72 162/72  Pulse:  61 62 72  Temp: 97.9 F (36.6 C)     TempSrc:      Resp:  18 21 18   SpO2:  95% 99% 100%    General:  Alert, Awake and Oriented to Time, Place and Person. Appear in mild distress Eyes: PERRL ENT: Oral Mucosa clear moist. Neck: No JVD Cardiovascular: S1 and S2 Present, no Murmur, Peripheral Pulses Present Respiratory: Bilateral Air entry equal and Decreased, Clear to Auscultation,  No Crackles, no wheezes Abdomen: Bowel Sound Present, Soft and Non tender Skin: No Rash Extremities: No Pedal edema, no calf tenderness Neurologic: Mental status anxious alert and oriented, Cranial Nerves pupils are reactive extraocular muscle movement intact, Motor strength normal strength, Sensation limited on left lower extremity otherwise normal, reflexes intact, babinski negative, Cerebellar test normal finger-nose-finger.  Labs on Admission:  CBC:  Recent Labs Lab 03/14/13 2016 03/14/13 2022  WBC 6.4  --   NEUTROABS 3.9  --   HGB 12.8* 14.3  HCT 38.2* 42.0  MCV 87.0  --   PLT 267  --     CMP     Component Value Date/Time   NA 140 03/14/2013 2022   NA 137 11/19/2012 1041   K 3.4* 03/14/2013 2022   K 4.2 11/19/2012 1041   CL 102 03/14/2013 2022   CO2 24 03/14/2013 2016   CO2 25 11/19/2012 1041   GLUCOSE 115* 03/14/2013 2022   GLUCOSE 103 11/19/2012 1041   BUN 15 03/14/2013 2022   BUN 20.0 11/19/2012 1041   CREATININE 0.90 03/14/2013 2022   CREATININE 0.8 11/19/2012 1041   CALCIUM 8.8 03/14/2013 2016   CALCIUM 8.8 11/19/2012 1041   PROT 6.4 03/14/2013 2016   PROT 5.7* 11/19/2012 1041   ALBUMIN 3.2* 03/14/2013 2016   ALBUMIN 2.8* 11/19/2012 1041   AST 19 03/14/2013 2016   AST 13 11/19/2012 1041   ALT 10 03/14/2013 2016   ALT 8 11/19/2012 1041   ALKPHOS 73 03/14/2013 2016   ALKPHOS 71 11/19/2012 1041   BILITOT 0.3 03/14/2013 2016   BILITOT 0.34 11/19/2012 1041   GFRNONAA 76* 03/14/2013 2016   GFRAA 88* 03/14/2013 2016    No results found for this basename: LIPASE, AMYLASE,  in the last 168 hours No results found for this basename: AMMONIA,  in the last 168 hours   Recent Labs Lab 03/14/13 2016   TROPONINI <0.30   BNP (last 3 results) No results found for this basename: PROBNP,  in the last 8760 hours  Radiological Exams on Admission: Ct Head Wo Contrast  03/14/2013   CLINICAL DATA:  Left-sided arm pain weakness  EXAM: CT HEAD WITHOUT CONTRAST  TECHNIQUE: Contiguous axial images were obtained from the base of the skull through the vertex without intravenous contrast.  COMPARISON:  None.  FINDINGS: No acute intracranial hemorrhage. No focal mass lesion. No CT evidence of acute infarction. No midline shift or mass effect. No hydrocephalus. Basilar cisterns are patent.  There is a generalized cortical atrophy and proportional ventricular dilatation. There is mild  periventricular subcortical white matter hypodensities. Paranasal sinuses and mastoid air cells are clear. Marland Kitchen  IMPRESSION: 1. No acute intracranial findings. 2. Atrophy and microvascular disease.   Electronically Signed   By: Suzy Bouchard M.D.   On: 03/14/2013 21:38    EKG: Independently reviewed. normal EKG, normal sinus rhythm, nonspecific ST and T waves changes.  Assessment/Plan Principal Problem:   TIA (transient ischemic attack) Active Problems:   HTN (hypertension)   Rectal cancer   Diarrhea   1. TIA (transient ischemic attack) The patient is presenting with complaints of left-sided numbness. His symptoms have minimally improved at present. His initial CT scan is negative and the rest of the lab work also unremarkable. he will be admitted for further workup including MRI, serial neuro checks, telemetry monitoring, echocardiogram, carotid Doppler His antiplatelets will be increased up to 325 aspirin per neuro I would also check hemoglobin A1c and lipid profile PTOT consult  2. Hypertension Blood pressure at present stable Continue home antihypertensive medications  3. Colon cancer Patient initially had some diarrhea but right now the diarrhea is resolved Continue to monitor  4. Hypothyroidism Continue  Synthroid  Consults: neurology appreciate input  DVT Prophylaxis: subcutaneous Heparin Nutrition: Advance as tolerated cardiac  Code Status: Full  Disposition: Admitted to observation in telemetry unit.  Author: Berle Mull, MD [facility] Pager: [phone] 03/15/2013, 4:05 AM    If 7PM-7AM, please contact night-coverage [email or web address] Password [email or web address]

## 2013-03-15 NOTE — ED Provider Notes (Signed)
I saw and evaluated the patient, reviewed the resident's note and I agree with the findings and plan.  EKG Interpretation    Date/Time:    Ventricular Rate:  73 PR Interval:  238 QRS Duration:   QT Interval:    QTC Calculation:   R Axis:     Text Interpretation:             Questionable TIA with resolving symptoms. Neuro consult pending.  Both typical and  atypical components   Hoy Morn, MD 03/15/13 0127

## 2013-03-15 NOTE — Progress Notes (Signed)
*  PRELIMINARY RESULTS* Vascular Ultrasound Carotid Duplex (Doppler) has been completed.   Findings suggest 1-39% internal carotid artery stenosis bilaterally. Vertebral arteries are patent with antegrade flow.  03/15/2013 11:51 AM Maudry Mayhew, RVT, RDCS, RDMS

## 2013-03-15 NOTE — Progress Notes (Signed)
Pt wanting to go home post MRI MD came to evaluate are done and results  Read. pt and told pt to wait till all pending workups and results . Pt reassured and needed care rendred

## 2013-03-15 NOTE — ED Notes (Signed)
Neurologist at bedside. 

## 2013-03-16 DIAGNOSIS — E785 Hyperlipidemia, unspecified: Secondary | ICD-10-CM

## 2013-03-16 DIAGNOSIS — I6350 Cerebral infarction due to unspecified occlusion or stenosis of unspecified cerebral artery: Secondary | ICD-10-CM

## 2013-03-16 LAB — GLUCOSE, CAPILLARY
GLUCOSE-CAPILLARY: 104 mg/dL — AB (ref 70–99)
GLUCOSE-CAPILLARY: 121 mg/dL — AB (ref 70–99)
Glucose-Capillary: 73 mg/dL (ref 70–99)
Glucose-Capillary: 128 mg/dL — ABNORMAL HIGH (ref 70–99)

## 2013-03-16 LAB — BASIC METABOLIC PANEL
BUN: 18 mg/dL (ref 6–23)
CO2: 22 mEq/L (ref 19–32)
Calcium: 8.6 mg/dL (ref 8.4–10.5)
Chloride: 106 mEq/L (ref 96–112)
Creatinine, Ser: 0.82 mg/dL (ref 0.50–1.35)
GFR, EST NON AFRICAN AMERICAN: 78 mL/min — AB (ref >90)
Glucose, Bld: 88 mg/dL (ref 70–99)
POTASSIUM: 4.1 meq/L (ref 3.7–5.3)
SODIUM: 141 meq/L (ref 137–147)

## 2013-03-16 MED ORDER — TRIAMCINOLONE 0.1 % CREAM:EUCERIN CREAM 1:1
1.0000 "application " | TOPICAL_CREAM | Freq: Three times a day (TID) | CUTANEOUS | Status: DC | PRN
  Administered 2013-03-16: 1 via TOPICAL
  Filled 2013-03-16: qty 1

## 2013-03-16 NOTE — Evaluation (Signed)
Physical Therapy Evaluation Patient Details Name: Alexander Johnston MRN: [id number] DOB: December 28, 1924 Today's Date: 03/16/2013 Time: 1443-1540 PT Time Calculation (min): 25 min  PT Assessment / Plan / Recommendation History of Present Illness  80 y.o. s/p Multiple areas of acute infarct are present. Small areas of restricted diffusion in the right cerebellum right medulla, superior cerebellar vermis, right temporal lobe, right frontal and parietal lobe, right occipital lobe, right splenium of the corpus callosum. Small areas are restricted diffusion in the left frontal and parietal lobe.  Clinical Impression  Patient demonstrates deficits in functional mobility as indicated below, patient will benefit from continued skilled PT to address deficits and maximize function. Will see as indicated and progress as tolerated. Recommend CIR consult for rehab upon acute discharge. Patient no safe for dc home.    PT Assessment  Patient needs continued PT services    Follow Up Recommendations  CIR    Does the patient have the potential to tolerate intense rehabilitation      Barriers to Discharge Decreased caregiver support lives alone, daughter 4 miles away, wife in [facility]    Equipment Recommendations  Other (comment) (TBD)    Recommendations for Other Services Rehab consult   Frequency Min 3X/week    Precautions / Restrictions Precautions Precautions: Fall Restrictions Weight Bearing Restrictions: No   Pertinent Vitals/Pain No pain at this time, VSS      Mobility  Bed Mobility Overal bed mobility: Needs Assistance Bed Mobility: Sit to Supine Sit to supine: Supervision Transfers Overall transfer level: Needs assistance Equipment used: Rolling walker (2 wheeled) Transfers: Sit to/from Stand Sit to Stand: Min guard General transfer comment: Cues for technique. Ambulation/Gait Ambulation/Gait assistance: Mod assist Ambulation Distance (Feet): 180 Feet Assistive device:  Rolling walker (2 wheeled);1 person hand held assist (RW 22ft; 1person HHA 120) Gait Pattern/deviations: Step-through pattern;Decreased dorsiflexion - left;Steppage;Staggering left;Drifts right/left;Trunk flexed;Narrow base of support Gait velocity: decreased Gait velocity interpretation: <1.8 ft/sec, indicative of risk for recurrent falls General Gait Details: Patient significantly unsteady with ambulation regardless of assistive device use. Patient constantly staggering to the left with ataxic like gait. Had 3 incidents of complete assist to prevent falling over and 1 incident where patietn fell into the wall while attempting to ambulate. Patient continuously running into objects (mainly on left but did happen occassionaly on the right as well). Unsafe to ambulate without physical assist. Modified Rankin (Stroke Patients Only) Pre-Morbid Rankin Score: No significant disability Modified Rankin: Moderately severe disability    Exercises     PT Diagnosis: Difficulty walking;Abnormality of gait  PT Problem List: Decreased strength;Decreased activity tolerance;Decreased balance;Decreased mobility;Decreased coordination;Decreased cognition;Decreased safety awareness PT Treatment Interventions: DME instruction;Gait training;Functional mobility training;Therapeutic activities;Therapeutic exercise;Balance training;Patient/family education     PT Goals(Current goals can be found in the care plan section) Acute Rehab PT Goals Patient Stated Goal: go home PT Goal Formulation: With patient Time For Goal Achievement: 03/30/13 Potential to Achieve Goals: Fair  Visit Information  Last PT Received On: 03/16/13 Assistance Needed: +1 History of Present Illness: 80 y.o. s/p Multiple areas of acute infarct are present. Small areas of restricted diffusion in the right cerebellum right medulla, superior cerebellar vermis, right temporal lobe, right frontal and parietal lobe, right occipital lobe, right  splenium of the corpus callosum. Small areas are restricted diffusion in the left frontal and parietal lobe.       Prior [place] expects to be discharged to:: Private residence Living Arrangements: Alone Available Help at Discharge:  Family;Available PRN/intermittently Type of Home: Apartment Home Access: Level entry Home Layout: One level Home Equipment: Grab bars - toilet;Cane - single point Additional Comments: patient with poor recognition of deficits, continues to state that he can walk just fine and that he has fallen before and is real careful. Prior Function Level of Independence: Independent with assistive device(s) Comments: cane Communication Communication: No difficulties    Cognition  Cognition Arousal/Alertness: Awake/alert Behavior During Therapy: WFL for tasks assessed/performed Overall Cognitive Status: Impaired/Different from baseline Area of Impairment: Safety/judgement;Problem solving Safety/Judgement: Decreased awareness of safety Problem Solving: Slow processing;Requires verbal cues;Requires tactile cues;Difficulty sequencing    Extremity/Trunk Assessment Upper Extremity Assessment Upper Extremity Assessment: LUE deficits/detail LUE Deficits / Details: slightly weaker than RUE Lower Extremity Assessment Lower Extremity Assessment: Overall WFL for tasks assessed   Balance Balance Overall balance assessment: Needs assistance High level balance activites: Side stepping;Backward walking;Direction changes;Turns;Head turns High Level Balance Comments: patient very unsteady requiring mod to max assist for high level balance deficits.  End of Session PT - End of Session Equipment Utilized During Treatment: Gait belt Activity Tolerance: Patient tolerated treatment well Patient left: in chair;with call bell/phone within reach;with chair alarm set Nurse Communication: Mobility status  GP Functional Assessment Tool Used: clinical  judgement Functional Limitation: Mobility: Walking and moving around Mobility: Walking and Moving Around Current Status (C1275): At least 40 percent but less than 60 percent impaired, limited or restricted Mobility: Walking and Moving Around Goal Status [phone]): At least 1 percent but less than 20 percent impaired, limited or restricted   Duncan Dull 03/16/2013, 10:26 AM Alben Deeds, PT DPT  [phone]

## 2013-03-16 NOTE — Evaluation (Addendum)
Occupational Therapy Evaluation Patient Details Name: Alexander Johnston MRN: [id number] DOB: [birth year] Today's Date: 03/16/2013 Time: 0932-6712 OT Time Calculation (min): 25 min  OT Assessment / Plan / Recommendation History of present illness 80 y.o. s/p Multiple areas of acute infarct are present. Small areas of restricted diffusion in the right cerebellum right medulla, superior cerebellar vermis, right temporal lobe, right frontal and parietal lobe, right occipital lobe, right splenium of the corpus callosum. Small areas are restricted diffusion in the left frontal and parietal lobe.   Clinical Impression   Pt presents with below problem list. Pt independent with ADLs, PTA. Pt will benefit from acute OT to increase independence prior to d/c. Recommending CIR for additional rehab to help achieve Mod I level prior to d/c.     OT Assessment  Patient needs continued OT Services    Follow Up Recommendations  CIR    Barriers to Discharge  Decreased caregiver support.    Equipment Recommendations  Other (comment) (defer to next venue)    Recommendations for Other Services Rehab consult  Frequency  Min 2X/week    Precautions / Restrictions Precautions Precautions: Fall Restrictions Weight Bearing Restrictions: No   Pertinent Vitals/Pain No pain when asked at end of session.     ADL  Grooming: Wash/dry hands;Teeth care;Minimal assistance (balance) Where Assessed - Grooming: Supported standing Lower Body Bathing: Min guard;Minimal assistance (balance; washed peri area) Where Assessed - Lower Body Bathing: Supported standing Upper Body Dressing: Set up Where Assessed - Upper Body Dressing: Supported sitting Lower Body Dressing: Minimal assistance Where Assessed - Lower Body Dressing: Supported sit to Lobbyist: Minimal Print production planner Method: Sit to Loss adjuster, chartered: Comfort height toilet;Grab bars;Regular height toilet Toileting - Clothing  Manipulation and Hygiene: Min guard Where Assessed - Best boy and Hygiene: Standing Tub/Shower Transfer Method: Not assessed Equipment Used: Gait belt;Rolling walker Transfers/Ambulation Related to ADLs: Mod A for ambulation-pt running into things with walker. Min guard/Min A for transfers. ADL Comments: Recommended sitting for bathing and dressing.     OT Diagnosis: Other (comment);Cognitive deficits;Disturbance of vision (decreased balance)  OT Problem List: Decreased strength;Decreased activity tolerance;Impaired balance (sitting and/or standing);Decreased coordination;Impaired vision/perception;Decreased safety awareness;Decreased knowledge of use of DME or AE;Decreased knowledge of precautions;Decreased cognition OT Treatment Interventions: Self-care/ADL training;DME and/or AE instruction;Therapeutic activities;Cognitive remediation/compensation;Visual/perceptual remediation/compensation;Patient/family education;Balance training   OT Goals(Current goals can be found in the care plan section) Acute Rehab OT Goals Patient Stated Goal: go home OT Goal Formulation: With patient Time For Goal Achievement: 03/23/13 Potential to Achieve Goals: Good ADL Goals Pt Will Perform Grooming: with supervision;standing Pt Will Perform Lower Body Bathing: with supervision;sit to/from stand Pt Will Perform Lower Body Dressing: sit to/from stand;with supervision Pt Will Transfer to Toilet: with supervision;ambulating;regular height toilet Pt Will Perform Toileting - Clothing Manipulation and hygiene: with supervision;sit to/from stand Additional ADL Goal #1: Pt will participate in further vision assessment.  Visit Information  Last OT Received On: 03/16/13 Assistance Needed: +1 History of Present Illness: 80 y.o. s/p Multiple areas of acute infarct are present. Small areas of restricted diffusion in the right cerebellum right medulla, superior cerebellar vermis, right temporal  lobe, right frontal and parietal lobe, right occipital lobe, right splenium of the corpus callosum. Small areas are restricted diffusion in the left frontal and parietal lobe.       Prior [place] expects to be discharged to:: Private residence Living Arrangements: Alone Available Help at Discharge:  Family;Available PRN/intermittently Type of Home: Apartment Home Access: Level entry Home Layout: One level Home Equipment: Grab bars - toilet;Cane - single point; shower chair Prior Function Level of Independence: Independent with assistive device(s) Comments: cane Communication Communication: No difficulties         Vision/Perception Vision - History Baseline Vision: Wears glasses only for reading Vision - Assessment Vision Assessment: Vision tested Tracking/Visual Pursuits: Other (comment) (difficulty tracking in left field) Visual Fields: Other (comment) (some inconsistencies on left )   Cognition  Cognition Arousal/Alertness: Awake/alert Behavior During Therapy: WFL for tasks assessed/performed Overall Cognitive Status: Impaired/Different from baseline Area of Impairment: Safety/judgement;Problem solving Safety/Judgement: Decreased awareness of safety Problem Solving: Slow processing;Requires verbal cues;Requires tactile cues;Difficulty sequencing    Extremity/Trunk Assessment Upper Extremity Assessment Upper Extremity Assessment: LUE deficits/detail LUE Deficits / Details: slightly weaker than RUE Lower Extremity Assessment Lower Extremity Assessment: Defer to PT evaluation     Mobility Bed Mobility Overal bed mobility: Needs Assistance Bed Mobility: Sit to Supine Sit to supine: Supervision Transfers Overall transfer level: Needs assistance Equipment used: Rolling walker (2 wheeled) Transfers: Sit to/from Stand Sit to Stand: Min guard;Min assist General transfer comment: Cues for technique.     Exercise     Balance      End of Session OT - End of Session Equipment Utilized During Treatment: Gait belt;Rolling walker Activity Tolerance: Patient tolerated treatment well Patient left: in chair;with call bell/phone within reach;with chair alarm set Nurse Communication: Mobility status  GO Functional Assessment Tool Used: clinical judgment Functional Limitation: Self care Self Care Current Status (U4403): At least 20 percent but less than 40 percent impaired, limited or restricted Self Care Goal Status (K7425): At least 1 percent but less than 20 percent impaired, limited or restricted   Benito Mccreedy OTR/L [phone] 03/16/2013, 10:19 AM

## 2013-03-16 NOTE — Progress Notes (Signed)
Stroke Team Progress Note  HISTORY Alexander Johnston is an 80 y.o. male who lives alone. Two days prior to admission he had an episode of left sided numbness. Symptoms resolved on their own and he did not mention it to his daughter until the next day. He seemed to be doing well that day and was even able to drive. On 03/14/2013 the patient was well and took a nap at 1PM. When he awakened he was lightheaded and off balance. He also again noted numbness in his left arm. His daughter came to the house to check on him and he had burned a rag cooking. She also noted that he had fallen. He was also having difficulty with language which is not usual for him. EMS was called and the patient was brought to [facility] for evaluation.   Date last known well: Date: 03/14/2013  Time last known well: Time: 13:00  tPA Given: No: Outside time window   SUBJECTIVE There are no family members present this morning. The patient is without complaints except he is anxious to go home.  OBJECTIVE Most recent Vital Signs: Filed Vitals:   03/15/13 1533 03/15/13 1831 03/16/13 0115 03/16/13 0500  BP: 129/69 128/56 137/60 115/63  Pulse: 64 72 64 62  Temp: 97.8 F (36.6 C) 98 F (36.7 C) 97.6 F (36.4 C) 97.8 F (36.6 C)  TempSrc: Oral Oral Oral Oral  Resp: 20 20 20 20   Height:      Weight:      SpO2: 99% 99% 99% 100%   CBG (last 3)   Recent Labs  03/15/13 1644 03/15/13 2235 03/16/13 0644  GLUCAP 106* 89 73    IV Fluid Intake:     MEDICATIONS  . amLODipine  10 mg Oral Daily  . aspirin  325 mg Oral Daily  . diltiazem  240 mg Oral Daily  . enoxaparin (LOVENOX) injection  40 mg Subcutaneous Q24H  . levothyroxine  25 mcg Oral QAC breakfast  . losartan  100 mg Oral Daily  . rosuvastatin  20 mg Oral Daily  . sertraline  25 mg Oral Daily   PRN:  nitroGLYCERIN  Diet:  Cardiac thin liquids Activity: Bathroom privileges with assistance DVT Prophylaxis:  Lovenox  CLINICALLY SIGNIFICANT STUDIES Basic Metabolic  Panel:   Recent Labs Lab 03/15/13 0335 03/16/13 0510  NA 139 141  K 3.5* 4.1  CL 102 106  CO2 23 22  GLUCOSE 101* 88  BUN 15 18  CREATININE 0.76 0.82  CALCIUM 8.7 8.6   Liver Function Tests:   Recent Labs Lab 03/14/13 2016 03/15/13 0335  AST 19 18  ALT 10 10  ALKPHOS 73 68  BILITOT 0.3 0.4  PROT 6.4 6.2  ALBUMIN 3.2* 3.2*   CBC:   Recent Labs Lab 03/14/13 2016 03/14/13 2022 03/15/13 0335  WBC 6.4  --  8.8  NEUTROABS 3.9  --  6.2  HGB 12.8* 14.3 12.6*  HCT 38.2* 42.0 37.3*  MCV 87.0  --  86.5  PLT 267  --  270   Coagulation:   Recent Labs Lab 03/14/13 2016  LABPROT 12.9  INR 0.99   Cardiac Enzymes:   Recent Labs Lab 03/14/13 2016  TROPONINI <0.30   Urinalysis:   Recent Labs Lab 03/14/13 2144  COLORURINE YELLOW  LABSPEC 1.012  PHURINE 5.5  GLUCOSEU NEGATIVE  HGBUR NEGATIVE  BILIRUBINUR NEGATIVE  KETONESUR NEGATIVE  PROTEINUR NEGATIVE  UROBILINOGEN 0.2  NITRITE NEGATIVE  LEUKOCYTESUR NEGATIVE   Lipid Panel  Component Value Date/Time   CHOL 140 03/15/2013 0500   TRIG 89 03/15/2013 0500   HDL 36* 03/15/2013 0500   CHOLHDL 3.9 03/15/2013 0500   VLDL 18 03/15/2013 0500   LDLCALC 86 03/15/2013 0500   HgbA1C  Lab Results  Component Value Date   HGBA1C 5.7* 03/15/2013    Urine Drug Screen:     Component Value Date/Time   LABOPIA NONE DETECTED 03/15/2013 0429   COCAINSCRNUR NONE DETECTED 03/15/2013 0429   LABBENZ NONE DETECTED 03/15/2013 0429   AMPHETMU NONE DETECTED 03/15/2013 0429   THCU NONE DETECTED 03/15/2013 0429   LABBARB NONE DETECTED 03/15/2013 0429    Alcohol Level:   Recent Labs Lab 03/15/13 0500  ETH <11    Ct Head Wo Contrast 03/14/2013   1. No acute intracranial findings. 2. Atrophy and microvascular disease.     Mri Brain Without Contrast 03/15/2013    Multiple areas of acute infarct are present. Small areas of restricted diffusion in the right cerebellum right medulla, superior cerebellar vermis, right temporal  lobe, right frontal and parietal lobe, right occipital lobe, right splenium of the corpus callosum. Small areas are restricted diffusion in the left frontal and parietal lobe. These are most consistent with multiple small emboli.  Numerous small areas of acute infarct as described above consistent with multiple small pulmonary emboli.  Atrophy and chronic ischemic changes.    MRA Head/brain Wo Cm 03/15/2013  Negative intracranial MRA      2D Echocardiogram  EF 50-55%. No cardiac source of emboli identified.  Carotid Doppler  pending  CXR    EKG  sinus rhythm rate 73 beats per minute  Therapy Recommendations pending  Physical Exam    Neurologic Examination:  Mental Status:  Alert, oriented. Repeats himself frequently. Has some difficulty with word finding. Fluent.. Able to follow 3 step commands without difficulty.  Cranial Nerves:  II: Discs flat bilaterally; Visual fields grossly normal, pupils equal, round, reactive to light and accommodation  III,IV, VI: ptosis not present, extra-ocular motions intact bilaterally  V,VII: smile symmetric, facial light touch sensation normal bilaterally  VIII: hearing decreased bilaterally  IX,X: gag reflex present  XI: bilateral shoulder shrug  XII: midline tongue extension  Motor:  Right : Upper extremity 5/5 Left: Upper extremity 5/5 with pronator drift  Lower extremity 5/5 Lower extremity 5/5                   Tone and bulk:normal tone throughout; no atrophy noted  Sensory: Pinprick and light touch decreased in the left upper extremity  Deep Tendon Reflexes: 2+ in the upper extremities, absent in the lower extremities.  Plantars:  Right: mute Left: mute  Cerebellar:  Mild dysmetria with finger-to-nose and heel-to-shin testing on the left  Gait: Unable to test safely  CV: pulses palpable throughout    ASSESSMENT Mr. Alexander Johnston is a 80 y.o. male presenting with balance problems, left upper extremity numbness, and speech  difficulties. TPA was not administered as the patient was outside the window for treatment. An MRI revealed multiple areas of acute infarct consistent with emboli of an unknown etiology. On aspirin 81 mg daily prior to admission. Now on aspirin 325 mg orally every day for secondary stroke prevention. Patient with resultant speech difficulties, coordination difficulties, mild left hemiparesis, and decreased sensation of the left upper extremity. Work up underway.   Hyperlipidemia - Crestor prior to admission - cholesterol 140 LDL 86  Coronary artery disease  Hypertension  History  of colon cancer  Previous stroke  Hospital day # 2  TREATMENT/PLAN  Continue aspirin 325 mg orally every day for secondary stroke prevention.  Await carotid Dopplers  Await therapy evaluations  Monitor heart rhythm  Order TEE per Dr Irish Elders.  Contact EP Monday if loop is needed.   I have personally obtained a history, examined the patient, evaluated imaging results, and formulated the assessment and plan of care. I agree with the above.  Embolic strokes, TEE

## 2013-03-16 NOTE — Progress Notes (Signed)
[facility] PROGRESS NOTE  Alexander Johnston JSE:[id number] DOB: May 30, 1924 DOA: 03/14/2013 PCP: [name], MD  Assessment/Plan: #1 acute CVA The MRI multiple areas of acute infarct consistent with emboli. Patient was on aspirin prior to admission and currently on full dose aspirin for secondary stroke prevention. Fasting lipid panel with LDL of 86 and patient and on a statin prior to admission. CT of the head was negative. 2-D echo with no source of emboli. Carotid Dopplers preliminary readings with no significant ICA stenosis. Due to distribution of stroke patient may need a TEE for further evaluation however will defer to neurology. Neurology is following and appreciate you and per recommendations.  #2 hyperlipidemia LDL of 86. Continue statin.  #3 hypertension Stable. Continue Norvasc and Cardizem and Cozaar.  #4 hypothyroidism Continue Synthroid.  #5 coronary artery disease Stable. Continue Norvasc, Cardizem, Cozaar, aspirin, Crestor.  #6 prophylaxis Lovenox for DVT prophylaxis.  Code Status: Full Family Communication: Updated patient no family at bedside. Disposition Plan: CIR versus SNF when medically stable   Consultants:  Neurology: Dr. Doy Mince 03/15/2013  Procedures:  CT at 03/14/2013  MRI MRA head 03/15/2013  2-D echo 03/15/2013  Carotid Dopplers 03/15/2013  Antibiotics:  None  HPI/Subjective: Patient asking when he can go home. No complaints.  Objective: Filed Vitals:   03/16/13 1037  BP: 117/58  Pulse: 117  Temp: 97.9 F (36.6 C)  Resp: 20    Intake/Output Summary (Last 24 hours) at 03/16/13 1233 Last data filed at 03/16/13 0602  Gross per 24 hour  Intake    720 ml  Output    375 ml  Net    345 ml   Filed Weights   03/15/13 0510  Weight: 88.27 kg (194 lb 9.6 oz)    Exam:   General:  NAD  Cardiovascular: RRR  Respiratory: CTAB  Abdomen: Soft, nontender, nondistended, positive bowel sounds  Musculoskeletal: No  clubbing cyanosis or edema  Data Reviewed: Basic Metabolic Panel:  Recent Labs Lab 03/14/13 2016 03/14/13 2022 03/15/13 0335 03/16/13 0510  NA 136* 140 139 141  K 3.5* 3.4* 3.5* 4.1  CL 99 102 102 106  CO2 24  --  23 22  GLUCOSE 120* 115* 101* 88  BUN 16 15 15 18   CREATININE 0.86 0.90 0.76 0.82  CALCIUM 8.8  --  8.7 8.6   Liver Function Tests:  Recent Labs Lab 03/14/13 2016 03/15/13 0335  AST 19 18  ALT 10 10  ALKPHOS 73 68  BILITOT 0.3 0.4  PROT 6.4 6.2  ALBUMIN 3.2* 3.2*   No results found for this basename: LIPASE, AMYLASE,  in the last 168 hours No results found for this basename: AMMONIA,  in the last 168 hours CBC:  Recent Labs Lab 03/14/13 2016 03/14/13 2022 03/15/13 0335  WBC 6.4  --  8.8  NEUTROABS 3.9  --  6.2  HGB 12.8* 14.3 12.6*  HCT 38.2* 42.0 37.3*  MCV 87.0  --  86.5  PLT 267  --  270   Cardiac Enzymes:  Recent Labs Lab 03/14/13 2016  TROPONINI <0.30   BNP (last 3 results) No results found for this basename: PROBNP,  in the last 8760 hours CBG:  Recent Labs Lab 03/15/13 1323 03/15/13 1644 03/15/13 2235 03/16/13 0644 03/16/13 1107  GLUCAP 102* 106* 89 73 104*    No results found for this or any previous visit (from the past 240 hour(s)).   Studies: Ct Head Wo Contrast  03/14/2013   CLINICAL DATA:  Left-sided arm pain weakness  EXAM: CT HEAD WITHOUT CONTRAST  TECHNIQUE: Contiguous axial images were obtained from the base of the skull through the vertex without intravenous contrast.  COMPARISON:  None.  FINDINGS: No acute intracranial hemorrhage. No focal mass lesion. No CT evidence of acute infarction. No midline shift or mass effect. No hydrocephalus. Basilar cisterns are patent.  There is a generalized cortical atrophy and proportional ventricular dilatation. There is mild periventricular subcortical white matter hypodensities. Paranasal sinuses and mastoid air cells are clear. Marland Kitchen  IMPRESSION: 1. No acute intracranial findings.  2. Atrophy and microvascular disease.   Electronically Signed   By: Suzy Bouchard M.D.   On: 03/14/2013 21:38   Mri Brain Without Contrast  03/15/2013   CLINICAL DATA:  Stroke.  Left-sided numbness.  EXAM: MRI HEAD WITHOUT CONTRAST  MRA HEAD WITHOUT CONTRAST  TECHNIQUE: Multiplanar, multiecho pulse sequences of the brain and surrounding structures were obtained without intravenous contrast. Angiographic images of the head were obtained using MRA technique without contrast.  COMPARISON:  CT head 03/14/2013  FINDINGS: MRI HEAD FINDINGS  Multiple areas of acute infarct are present. Small areas of restricted diffusion in the right cerebellum right medulla, superior cerebellar vermis, right temporal lobe, right frontal and parietal lobe, right occipital lobe, right splenium of the corpus callosum. Small areas are restricted diffusion in the left frontal and parietal lobe. These are most consistent with multiple small emboli.  Moderate atrophy. Chronic microvascular ischemic change in the white matter. Chronic right occipital infarct. Chronic ischemia in the cerebellum bilaterally and in the right lower pons and medulla. Small chronic infarcts in the thalamus bilaterally. Negative for hemorrhage or mass lesion.  MRA HEAD FINDINGS  Right vertebral artery is patent without stenosis. Left vertebral artery ends in PICA and does not contribute to the basilar. The basilar is patent. Superior cerebellar and posterior cerebral arteries are patent. Fetal origin of the left posterior cerebral artery with hypoplastic left P1 segment.  Internal carotid artery is patent bilaterally without stenosis. Anterior and middle cerebral arteries are widely patent.  Negative for cerebral aneurysm.  IMPRESSION: Numerous small areas of acute infarct as described above consistent with multiple small pulmonary emboli.  Atrophy and chronic ischemic changes.  Negative intracranial MRA   Electronically Signed   By: Franchot Gallo M.D.   On:  03/15/2013 13:42   Mr Jodene Nam Head/brain Wo Cm  03/15/2013   CLINICAL DATA:  Stroke.  Left-sided numbness.  EXAM: MRI HEAD WITHOUT CONTRAST  MRA HEAD WITHOUT CONTRAST  TECHNIQUE: Multiplanar, multiecho pulse sequences of the brain and surrounding structures were obtained without intravenous contrast. Angiographic images of the head were obtained using MRA technique without contrast.  COMPARISON:  CT head 03/14/2013  FINDINGS: MRI HEAD FINDINGS  Multiple areas of acute infarct are present. Small areas of restricted diffusion in the right cerebellum right medulla, superior cerebellar vermis, right temporal lobe, right frontal and parietal lobe, right occipital lobe, right splenium of the corpus callosum. Small areas are restricted diffusion in the left frontal and parietal lobe. These are most consistent with multiple small emboli.  Moderate atrophy. Chronic microvascular ischemic change in the white matter. Chronic right occipital infarct. Chronic ischemia in the cerebellum bilaterally and in the right lower pons and medulla. Small chronic infarcts in the thalamus bilaterally. Negative for hemorrhage or mass lesion.  MRA HEAD FINDINGS  Right vertebral artery is patent without stenosis. Left vertebral artery ends in PICA and does not contribute to the basilar. The basilar  is patent. Superior cerebellar and posterior cerebral arteries are patent. Fetal origin of the left posterior cerebral artery with hypoplastic left P1 segment.  Internal carotid artery is patent bilaterally without stenosis. Anterior and middle cerebral arteries are widely patent.  Negative for cerebral aneurysm.  IMPRESSION: Numerous small areas of acute infarct as described above consistent with multiple small pulmonary emboli.  Atrophy and chronic ischemic changes.  Negative intracranial MRA   Electronically Signed   By: Franchot Gallo M.D.   On: 03/15/2013 13:42    Scheduled Meds: . amLODipine  10 mg Oral Daily  . aspirin  325 mg Oral Daily   . diltiazem  240 mg Oral Daily  . enoxaparin (LOVENOX) injection  40 mg Subcutaneous Q24H  . levothyroxine  25 mcg Oral QAC breakfast  . losartan  100 mg Oral Daily  . rosuvastatin  20 mg Oral Daily  . sertraline  25 mg Oral Daily   Continuous Infusions:   Principal Problem:   CVA (cerebral infarction) Active Problems:   HTN (hypertension)   Other and unspecified hyperlipidemia   Rectal cancer   Diarrhea    Time spent: 35 minutes    [facility] MD [facility] Pager [phone]. If 7PM-7AM, please contact night-coverage at [email or web address], password [facility] 03/16/2013, 12:33 PM  LOS: 2 days

## 2013-03-17 ENCOUNTER — Encounter (HOSPITAL_COMMUNITY)
Admission: EM | Disposition: A | Discharge status: Rehabilitation Facility | Payer: Self-pay | Source: Home / Self Care | Attending: Internal Medicine

## 2013-03-17 DIAGNOSIS — I6340 Cerebral infarction due to embolism of unspecified cerebral artery: Secondary | ICD-10-CM

## 2013-03-17 LAB — GLUCOSE, CAPILLARY
Glucose-Capillary: 92 mg/dL (ref 70–99)
Glucose-Capillary: 102 mg/dL — ABNORMAL HIGH (ref 70–99)
Glucose-Capillary: 107 mg/dL — ABNORMAL HIGH (ref 70–99)
Glucose-Capillary: 120 mg/dL — ABNORMAL HIGH (ref 70–99)

## 2013-03-17 LAB — BASIC METABOLIC PANEL
BUN: 19 mg/dL (ref 6–23)
CALCIUM: 8.5 mg/dL (ref 8.4–10.5)
CO2: 23 meq/L (ref 19–32)
Chloride: 105 mEq/L (ref 96–112)
Creatinine, Ser: 0.78 mg/dL (ref 0.50–1.35)
GFR calc Af Amer: >90 mL/min (ref >90)
GFR calc non Af Amer: 79 mL/min — ABNORMAL LOW (ref >90)
GLUCOSE: 99 mg/dL (ref 70–99)
Potassium: 3.8 mEq/L (ref 3.7–5.3)
Sodium: 140 mEq/L (ref 137–147)

## 2013-03-17 SURGERY — ECHOCARDIOGRAM, TRANSESOPHAGEAL
Anesthesia: Moderate Sedation

## 2013-03-17 MED ORDER — ACETAMINOPHEN 325 MG PO TABS
650.0000 mg | ORAL_TABLET | Freq: Four times a day (QID) | ORAL | Status: DC | PRN
  Administered 2013-03-17 (×2): 650 mg via ORAL
  Filled 2013-03-17 (×2): qty 2

## 2013-03-17 NOTE — Progress Notes (Signed)
Stroke Team Progress Note  HISTORY Alexander Johnston is an 80 y.o. male who lives alone. Two days prior to admission he had an episode of left sided numbness. Symptoms resolved on their own and he did not mention it to his daughter until the next day. He seemed to be doing well that day and was even able to drive. On 03/14/2013 the patient was well and took a nap at 1PM. When he awakened he was lightheaded and off balance. He also again noted numbness in his left arm. His daughter came to the house to check on him and he had burned a rag cooking. She also noted that he had fallen. He was also having difficulty with language which is not usual for him. EMS was called and the patient was brought to [facility] for evaluation.   Date last known well: Date: 03/14/2013  Time last known well: Time: 13:00  tPA Given: No: Outside time window  SUBJECTIVE Patient up in chair at bedside. No family present. "I want to go home, please send me home." pt refused TEE this am "what good would it do - i haven't event had my aspirin - I want to go home."  OBJECTIVE Most recent Vital Signs: Filed Vitals:   03/16/13 2059 03/17/13 0053 03/17/13 0541 03/17/13 0959  BP: 137/88 121/57 127/47 136/77  Pulse: 69 56 59 66  Temp: 98.5 F (36.9 C) 97.9 F (36.6 C) 97.6 F (36.4 C) 98 F (36.7 C)  TempSrc: Oral Oral Oral Oral  Resp: 18 20 20 20   Height:      Weight:      SpO2: 100% 99% 100% 98%   CBG (last 3)   Recent Labs  03/16/13 1644 03/16/13 2058 03/17/13 0618  GLUCAP 128* 121* 92    IV Fluid Intake:     MEDICATIONS  . amLODipine  10 mg Oral Daily  . aspirin  325 mg Oral Daily  . diltiazem  240 mg Oral Daily  . enoxaparin (LOVENOX) injection  40 mg Subcutaneous Q24H  . levothyroxine  25 mcg Oral QAC breakfast  . losartan  100 mg Oral Daily  . rosuvastatin  20 mg Oral Daily  . sertraline  25 mg Oral Daily   PRN:  acetaminophen, nitroGLYCERIN, triamcinolone 0.1 % cream : eucerin  Diet:  NPO  Activity:  Bathroom privileges with assistance DVT Prophylaxis:  Lovenox  CLINICALLY SIGNIFICANT STUDIES Basic Metabolic Panel:   Recent Labs Lab 03/16/13 0510 03/17/13 0644  NA 141 140  K 4.1 3.8  CL 106 105  CO2 22 23  GLUCOSE 88 99  BUN 18 19  CREATININE 0.82 0.78  CALCIUM 8.6 8.5   Liver Function Tests:   Recent Labs Lab 03/14/13 2016 03/15/13 0335  AST 19 18  ALT 10 10  ALKPHOS 73 68  BILITOT 0.3 0.4  PROT 6.4 6.2  ALBUMIN 3.2* 3.2*   CBC:   Recent Labs Lab 03/14/13 2016 03/14/13 2022 03/15/13 0335  WBC 6.4  --  8.8  NEUTROABS 3.9  --  6.2  HGB 12.8* 14.3 12.6*  HCT 38.2* 42.0 37.3*  MCV 87.0  --  86.5  PLT 267  --  270   Coagulation:   Recent Labs Lab 03/14/13 2016  LABPROT 12.9  INR 0.99   Cardiac Enzymes:   Recent Labs Lab 03/14/13 2016  TROPONINI <0.30   Urinalysis:   Recent Labs Lab 03/14/13 2144  COLORURINE YELLOW  LABSPEC 1.012  PHURINE 5.5  GLUCOSEU NEGATIVE  HGBUR NEGATIVE  BILIRUBINUR NEGATIVE  KETONESUR NEGATIVE  PROTEINUR NEGATIVE  UROBILINOGEN 0.2  NITRITE NEGATIVE  LEUKOCYTESUR NEGATIVE   Lipid Panel    Component Value Date/Time   CHOL 140 03/15/2013 0500   TRIG 89 03/15/2013 0500   HDL 36* 03/15/2013 0500   CHOLHDL 3.9 03/15/2013 0500   VLDL 18 03/15/2013 0500   LDLCALC 86 03/15/2013 0500   HgbA1C  Lab Results  Component Value Date   HGBA1C 5.7* 03/15/2013    Urine Drug Screen:     Component Value Date/Time   LABOPIA NONE DETECTED 03/15/2013 0429   COCAINSCRNUR NONE DETECTED 03/15/2013 0429   LABBENZ NONE DETECTED 03/15/2013 0429   AMPHETMU NONE DETECTED 03/15/2013 0429   THCU NONE DETECTED 03/15/2013 0429   LABBARB NONE DETECTED 03/15/2013 0429    Alcohol Level:   Recent Labs Lab 03/15/13 0500  ETH <11    Ct Head Wo Contrast 03/14/2013   1. No acute intracranial findings. 2. Atrophy and microvascular disease.    Mri Brain Without Contrast 03/15/2013    Multiple areas of acute infarct are present. Small  areas of restricted diffusion in the right cerebellum right medulla, superior cerebellar vermis, right temporal lobe, right frontal and parietal lobe, right occipital lobe, right splenium of the corpus callosum. Small areas are restricted diffusion in the left frontal and parietal lobe. These are most consistent with multiple small emboli.  Numerous small areas of acute infarct as described above consistent with multiple small pulmonary emboli.  Atrophy and chronic ischemic changes.    MRA Head/brain Wo Cm 03/15/2013  Negative intracranial MRA     2D Echocardiogram  EF 50-55%. No cardiac source of emboli identified.  Carotid Doppler  No evidence of hemodynamically significant internal carotid artery stenosis. Vertebral artery flow is antegrade.   EKG  sinus rhythm rate 73 beats per minute  Therapy Recommendations CIR  Physical Exam   Neurologic Examination:  Mental Status:  Alert, oriented. Repeats himself frequently. Has some difficulty with word finding. Fluent.. Able to follow 3 step commands without difficulty.  Cranial Nerves:  II: Discs flat bilaterally; Visual fields grossly normal, pupils equal, round, reactive to light and accommodation  III,IV, VI: ptosis not present, extra-ocular motions intact bilaterally  V,VII: smile symmetric, facial light touch sensation normal bilaterally  VIII: hearing decreased bilaterally  IX,X: gag reflex present  XI: bilateral shoulder shrug  XII: midline tongue extension  Motor:  Right : Upper extremity 5/5 Left: Upper extremity 5/5 with pronator drift  Lower extremity 5/5 Lower extremity 5/5                   Tone and bulk:normal tone throughout; no atrophy noted  Sensory: Pinprick and light touch decreased in the left upper extremity  Deep Tendon Reflexes: 2+ in the upper extremities, absent in the lower extremities.  Plantars:  Right: mute Left: mute  Cerebellar:  Mild dysmetria with finger-to-nose and heel-to-shin testing on the left   Gait: Unable to test safely  CV: pulses palpable throughout    ASSESSMENT Mr. Alexander Johnston is a 80 y.o. male presenting with balance problems, left upper extremity numbness, and speech difficulties. TPA was not administered as the patient was outside the window for treatment. An MRI revealed multiple bilateral areas of acute infarct in anterior and posterior circulation consistent with emboli of an unknown etiology. On aspirin 81 mg daily prior to admission. Now on aspirin 325 mg orally every day for secondary stroke prevention. Patient with  resultant speech difficulties, coordination difficulties, mild left hemiparesis, and decreased sensation of the left upper extremity, confusion. Work up underway.  Patient refused TEE this am. Unclear if pt understands the need for having the test - judgement seems unrealistic. Dr. Leonie Man called and spoke with his daughter Alexander Johnston, who confirms she found pt with confusion after onset of his stroke symptoms. She reports his oldest daughter who lives in [place] is his POA. She is going to call her. Daughter is thinking pt may need placement at discharge should this confusion continue.   Hyperlipidemia - Crestor prior to admission and now - cholesterol 140 LDL 86  Coronary artery disease  Hypertension  History of colon cancer  Previous stroke  Hospital day # 3  TREATMENT/PLAN  Continue aspirin 325 mg orally every day for secondary stroke prevention.  Given mental status and unwillingness for testing, not a good candidate for TEE or loop recorder  Monitor heart rhythm while an IP  IP Rehab vs SNF at discharge. Rehab consult in place  D/w daughter and Dr Levonne Hubert, MSN, RN, ANVP-BC, ANP-BC, GNP-BC [facility] Pager: [phone] 03/17/2013 11:43 AM  I have personally obtained a history, examined the patient, evaluated imaging results, and formulated the assessment and plan of care. I agree with the above. Antony Contras,  MD

## 2013-03-17 NOTE — Progress Notes (Signed)
[facility] PROGRESS NOTE  Bradden P Curb MRN:[id number] DOB: 09/14/1924 DOA: 03/14/2013 PCP: TRIPP, HENRY, MD  Assessment/Plan: #1 acute CVA The MRI multiple areas of acute infarct consistent with emboli. Patient was on aspirin prior to admission and currently on full dose aspirin for secondary stroke prevention. Fasting lipid panel with LDL of 86 and patient and on a statin prior to admission. CT of the head was negative. 2-D echo with no source of emboli. Carotid Dopplers preliminary readings with no significant ICA stenosis. Patient refused TEE today. Patient however is agreeable to TEE to be done tomorrow. Neurology is following and appreciate you and per recommendations.  #2 hyperlipidemia LDL of 86. Continue statin.  #3 hypertension Stable. Continue Norvasc and Cardizem and Cozaar.  #4 hypothyroidism Continue Synthroid.  #5 coronary artery disease Stable. Continue Norvasc, Cardizem, Cozaar, aspirin, Crestor.  #6 prophylaxis Lovenox for DVT prophylaxis.  Code Status: Full Family Communication: Updated patient, daughter and son-in-law. Disposition Plan: CIR versus SNF when medically stable   Consultants:  Neurology: Dr. Reynolds 03/15/2013  Procedures:  CT at 03/14/2013  MRI MRA head 03/15/2013  2-D echo 03/15/2013  Carotid Dopplers 03/15/2013  Antibiotics:  None  HPI/Subjective: Patient asking when he can go home. Patient refused TEE today.  Objective: Filed Vitals:   03/17/13 0959  BP: 136/77  Pulse: 66  Temp: 98 F (36.7 C)  Resp: 20    Intake/Output Summary (Last 24 hours) at 03/17/13 1540 Last data filed at 03/17/13 1001  Gross per 24 hour  Intake      0 ml  Output    800 ml  Net   -800 ml   Filed Weights   03/15/13 0510  Weight: 88.27 kg (194 lb 9.6 oz)    Exam:   General:  NAD  Cardiovascular: RRR  Respiratory: CTAB  Abdomen: Soft, nontender, nondistended, positive bowel sounds  Musculoskeletal: No clubbing cyanosis  or edema  Data Reviewed: Basic Metabolic Panel:  Recent Labs Lab 03/14/13 2016 03/14/13 2022 03/15/13 0335 03/16/13 0510 03/17/13 0644  NA 136* 140 139 141 140  K 3.5* 3.4* 3.5* 4.1 3.8  CL 99 102 102 106 105  CO2 24  --  23 22 23  GLUCOSE 120* 115* 101* 88 99  BUN 16 15 15 18 19  CREATININE 0.86 0.90 0.76 0.82 0.78  CALCIUM 8.8  --  8.7 8.6 8.5   Liver Function Tests:  Recent Labs Lab 03/14/13 2016 03/15/13 0335  AST 19 18  ALT 10 10  ALKPHOS 73 68  BILITOT 0.3 0.4  PROT 6.4 6.2  ALBUMIN 3.2* 3.2*   No results found for this basename: LIPASE, AMYLASE,  in the last 168 hours No results found for this basename: AMMONIA,  in the last 168 hours CBC:  Recent Labs Lab 03/14/13 2016 03/14/13 2022 03/15/13 0335  WBC 6.4  --  8.8  NEUTROABS 3.9  --  6.2  HGB 12.8* 14.3 12.6*  HCT 38.2* 42.0 37.3*  MCV 87.0  --  86.5  PLT 267  --  270   Cardiac Enzymes:  Recent Labs Lab 03/14/13 2016  TROPONINI <0.30   BNP (last 3 results) No results found for this basename: PROBNP,  in the last 8760 hours CBG:  Recent Labs Lab 03/16/13 1107 03/16/13 1644 03/16/13 2058 03/17/13 0618 03/17/13 1133  GLUCAP 104* 128* 121* 92 102*    No results found for this or any previous visit (from the past 240 hour(s)).   Studies: No results   found.  Scheduled Meds: . amLODipine  10 mg Oral Daily  . aspirin  325 mg Oral Daily  . diltiazem  240 mg Oral Daily  . enoxaparin (LOVENOX) injection  40 mg Subcutaneous Q24H  . levothyroxine  25 mcg Oral QAC breakfast  . losartan  100 mg Oral Daily  . rosuvastatin  20 mg Oral Daily  . sertraline  25 mg Oral Daily   Continuous Infusions:   Principal Problem:   CVA (cerebral infarction) Active Problems:   HTN (hypertension)   Other and unspecified hyperlipidemia   Rectal cancer   Diarrhea    Time spent: 35 minutes    [facility] MD [facility] Pager [phone]. If 7PM-7AM, please contact night-coverage at  [email or web address], password [facility] 03/17/2013, 3:40 PM  LOS: 3 days

## 2013-03-17 NOTE — Progress Notes (Signed)
Rehab admissions - Evaluated for possible admission to acute inpatient rehab.  Noted patient has refused TEE.  Please see rehab consult done today by Dr. Naaman Plummer.  I have called and left a message with patient's daughter to discuss rehab options.  Call me for questions.  #[phone]

## 2013-03-17 NOTE — Progress Notes (Signed)
Rehab Admissions Coordinator Note:  Patient was screened by Retta Diones for appropriateness for an Inpatient Acute Rehab Consult.  At this time, an inpatient rehab consult has been ordered and is pending completion.  Retta Diones 03/17/2013, 8:37 AM  I can be reached at [phone].

## 2013-03-17 NOTE — Progress Notes (Signed)
Endo nures and transfort came to pick pt up for TEE but pt refused rational for test explained by the nurse and pt still refused.and wants the cardiology to talk to him. Pt remain npo.

## 2013-03-17 NOTE — Consult Note (Signed)
Physical Medicine and Rehabilitation Consult  Reason for Consult:  Left sided numbness Referring Physician: Dr. Grandville Silos   HPI: Alexander Johnston is a 80 y.o. male with history of CAD, IBS, CVA, who was admitted on 03/15/13 with left sided numbness, fall and difficulty speaking. Patient reported to have similar symptoms few days before that resolved on its own. MRI and MRA brain multiple small areas of acute infarct affecting  right cerebellum right medulla, superior  cerebellar vermis, right temporal lobe, right frontal and parietal lobe, right occipital lobe, right splenium of the corpus callosum as well as left frontal and parietal lobe. 2D echo with EF 36-64%, grade 1 diastolic dysfunction and no wall abnormality. Carotid dopplers without significant stenosis. Neurology consulted and recommended changing ASA to 325 mg/dailly as well as TEE for work up of embolic source with unknown source. PT/OT evaluations done yesterday and CIR recommended. Patient with resultant mild dysmetria LUE, mild left hemiparesis with decreased sensation LUE, as well as ataxic gait with multiple episodes of  LOB with ambulation. PT, OT, MD recommending CIR.     ROS  Past Medical History  Diagnosis Date  . CAD (coronary artery disease)   . Hypertension   . Stroke   . GERD (gastroesophageal reflux disease)   . Diarrhea     intermittent past 4 years  . Colon cancer 10/17/12    adenocarcinoma  . IBS (irritable bowel syndrome)   . Hypothyroidism    Past Surgical History  Procedure Laterality Date  . Knee surgery      bilateral  . Colonoscopy w/ biopsies  10/17/12    rectosigmoid=adenocarcinoma  . Eus N/A 11/07/2012    Procedure: LOWER ENDOSCOPIC ULTRASOUND (EUS);  Surgeon: Milus Banister, MD;  Location: [facility];  Service: Endoscopy;  Laterality: N/A;    Family History  Problem Relation Age of Onset  . Heart disease Mother     "all her family"  . Diabetes Brother   . Hypertension Brother   .  Cancer Daughter     choriocarricinoma   Social History:  Lives alone. reports that he has never smoked. He has never used smokeless tobacco. He reports that he does not drink alcohol or use illicit drugs.  Allergies: No Known Allergies  Medications Prior to Admission  Medication Sig Dispense Refill  . amLODipine (NORVASC) 10 MG tablet Take 10 mg by mouth daily.      Marland Kitchen aspirin 81 MG chewable tablet Chew 162 mg by mouth once.      . diltiazem (CARDIZEM CD) 240 MG 24 hr capsule Take 240 mg by mouth daily.      . hydrochlorothiazide (HYDRODIURIL) 25 MG tablet Take 12.5 mg by mouth daily.      Marland Kitchen levothyroxine (SYNTHROID, LEVOTHROID) 25 MCG tablet Take 25 mcg by mouth daily before breakfast.      . loperamide (IMODIUM) 2 MG capsule Take 2 mg by mouth 2 (two) times daily.      Marland Kitchen losartan (COZAAR) 100 MG tablet Take 100 mg by mouth daily.      . Multiple Vitamin (ONE-A-DAY MENS PO) Take 1 tablet by mouth daily.      . nitroGLYCERIN (NITROSTAT) 0.4 MG SL tablet Place 0.4 mg under the tongue every 5 (five) minutes as needed for chest pain.      Marland Kitchen OVER THE COUNTER MEDICATION Take 2-3 sprays by mouth daily as needed ("essential oils" spray).      . pentoxifylline (TRENTAL) 400 MG  CR tablet Take 400 mg by mouth 3 (three) times daily with meals.      . sertraline (ZOLOFT) 50 MG tablet Take 50 mg by mouth daily.      . metoprolol (TOPROL-XL) 200 MG 24 hr tablet Take 200 mg by mouth daily.      . rosuvastatin (CRESTOR) 40 MG tablet Take 40 mg by mouth daily.        Home: Home Living Family/patient expects to be discharged to:: Private residence Living Arrangements: Alone Available Help at Discharge: Family;Available PRN/intermittently Type of Home: Apartment Home Access: Level entry Home Layout: One level Home Equipment: Grab bars - toilet;Cane - single point Additional Comments: patient with poor recognition of deficits, continues to state that he can walk just fine and that he has fallen before  and is real careful.  Functional History: Prior Function Comments: cane Functional Status:  Mobility:     Ambulation/Gait Ambulation Distance (Feet): 180 Feet Gait velocity: decreased General Gait Details: Patient significantly unsteady with ambulation regardless of assistive device use. Patient constantly staggering to the left with ataxic like gait. Had 3 incidents of complete assist to prevent falling over and 1 incident where patietn fell into the wall while attempting to ambulate. Patient continuously running into objects (mainly on left but did happen occassionaly on the right as well). Unsafe to ambulate without physical assist.    ADL: ADL Grooming: Wash/dry hands;Teeth care;Minimal assistance (balance) Where Assessed - Grooming: Supported standing Lower Body Bathing: Min guard;Minimal assistance (balance; washed peri area) Where Assessed - Lower Body Bathing: Supported standing Upper Body Dressing: Set up Where Assessed - Upper Body Dressing: Supported sitting Lower Body Dressing: Minimal assistance Where Assessed - Lower Body Dressing: Supported sit to Lobbyist: Minimal Print production planner Method: Sit to Loss adjuster, chartered: Comfort height toilet;Grab bars;Regular height toilet Tub/Shower Transfer Method: Not assessed Equipment Used: Gait belt;Rolling walker Transfers/Ambulation Related to ADLs: Mod A for ambulation-pt running into things with walker. Min guard/Min A for transfers. ADL Comments: Recommended sitting for bathing and dressing.   Cognition: Cognition Overall Cognitive Status: Impaired/Different from baseline Orientation Level: Oriented X4 Cognition Arousal/Alertness: Awake/alert Behavior During Therapy: WFL for tasks assessed/performed Overall Cognitive Status: Impaired/Different from baseline Area of Impairment: Safety/judgement;Problem solving Safety/Judgement: Decreased awareness of safety Problem Solving: Slow  processing;Requires verbal cues;Requires tactile cues;Difficulty sequencing  Blood pressure 127/47, pulse 59, temperature 97.6 F (36.4 C), temperature source Oral, resp. rate 20, height 6\' 2"  (1.88 m), weight 88.27 kg (194 lb 9.6 oz), SpO2 100.00%. Physical Exam  Constitutional: He appears well-developed and well-nourished.  HENT:  Head: Normocephalic and atraumatic.  Eyes: Conjunctivae and EOM are normal. Pupils are equal, round, and reactive to light.  Neck: No JVD present. No tracheal deviation present. No thyromegaly present.  Cardiovascular: Normal rate.   Respiratory: No respiratory distress.  GI: He exhibits no distension. There is no tenderness.  Lymphadenopathy:    He has no cervical adenopathy.  Neurological:  Pt anxious. Moves all 4's fairly equally. No left to right sensory differences. No resting tone. Coordination fair to good. Decreased insight and awareness. No CN abnl.   Skin:  Breakdown over right 1st two toes and along medial ankle.  Psychiatric:  anxious    Results for orders placed during the hospital encounter of 03/14/13 (from the past 24 hour(s))  GLUCOSE, CAPILLARY     Status: Abnormal   Collection Time    03/16/13 11:07 AM      Result Value Range  Glucose-Capillary 104 (*) 70 - 99 mg/dL  GLUCOSE, CAPILLARY     Status: Abnormal   Collection Time    03/16/13  4:44 PM      Result Value Range   Glucose-Capillary 128 (*) 70 - 99 mg/dL  GLUCOSE, CAPILLARY     Status: Abnormal   Collection Time    03/16/13  8:58 PM      Result Value Range   Glucose-Capillary 121 (*) 70 - 99 mg/dL   Comment 1 Documented in Chart     Comment 2 Notify RN    GLUCOSE, CAPILLARY     Status: None   Collection Time    03/17/13  6:18 AM      Result Value Range   Glucose-Capillary 92  70 - 99 mg/dL   Comment 1 Documented in Chart     Comment 2 Notify RN    BASIC METABOLIC PANEL     Status: Abnormal   Collection Time    03/17/13  6:44 AM      Result Value Range   Sodium  140  137 - 147 mEq/L   Potassium 3.8  3.7 - 5.3 mEq/L   Chloride 105  96 - 112 mEq/L   CO2 23  19 - 32 mEq/L   Glucose, Bld 99  70 - 99 mg/dL   BUN 19  6 - 23 mg/dL   Creatinine, Ser 0.78  0.50 - 1.35 mg/dL   Calcium 8.5  8.4 - 10.5 mg/dL   GFR calc non Af Amer 79 (*) >90 mL/min   GFR calc Af Amer >90  >90 mL/min   Mri Brain Without Contrast  03/15/2013   CLINICAL DATA:  Stroke.  Left-sided numbness.  EXAM: MRI HEAD WITHOUT CONTRAST  MRA HEAD WITHOUT CONTRAST  TECHNIQUE: Multiplanar, multiecho pulse sequences of the brain and surrounding structures were obtained without intravenous contrast. Angiographic images of the head were obtained using MRA technique without contrast.  COMPARISON:  CT head 03/14/2013  FINDINGS: MRI HEAD FINDINGS  Multiple areas of acute infarct are present. Small areas of restricted diffusion in the right cerebellum right medulla, superior cerebellar vermis, right temporal lobe, right frontal and parietal lobe, right occipital lobe, right splenium of the corpus callosum. Small areas are restricted diffusion in the left frontal and parietal lobe. These are most consistent with multiple small emboli.  Moderate atrophy. Chronic microvascular ischemic change in the white matter. Chronic right occipital infarct. Chronic ischemia in the cerebellum bilaterally and in the right lower pons and medulla. Small chronic infarcts in the thalamus bilaterally. Negative for hemorrhage or mass lesion.  MRA HEAD FINDINGS  Right vertebral artery is patent without stenosis. Left vertebral artery ends in PICA and does not contribute to the basilar. The basilar is patent. Superior cerebellar and posterior cerebral arteries are patent. Fetal origin of the left posterior cerebral artery with hypoplastic left P1 segment.  Internal carotid artery is patent bilaterally without stenosis. Anterior and middle cerebral arteries are widely patent.  Negative for cerebral aneurysm.  IMPRESSION: Numerous small areas  of acute infarct as described above consistent with multiple small pulmonary emboli.  Atrophy and chronic ischemic changes.  Negative intracranial MRA   Electronically Signed   By: Franchot Gallo M.D.   On: 03/15/2013 13:42   Mr Jodene Nam Head/brain Wo Cm  03/15/2013   CLINICAL DATA:  Stroke.  Left-sided numbness.  EXAM: MRI HEAD WITHOUT CONTRAST  MRA HEAD WITHOUT CONTRAST  TECHNIQUE: Multiplanar, multiecho pulse sequences of the brain  and surrounding structures were obtained without intravenous contrast. Angiographic images of the head were obtained using MRA technique without contrast.  COMPARISON:  CT head 03/14/2013  FINDINGS: MRI HEAD FINDINGS  Multiple areas of acute infarct are present. Small areas of restricted diffusion in the right cerebellum right medulla, superior cerebellar vermis, right temporal lobe, right frontal and parietal lobe, right occipital lobe, right splenium of the corpus callosum. Small areas are restricted diffusion in the left frontal and parietal lobe. These are most consistent with multiple small emboli.  Moderate atrophy. Chronic microvascular ischemic change in the white matter. Chronic right occipital infarct. Chronic ischemia in the cerebellum bilaterally and in the right lower pons and medulla. Small chronic infarcts in the thalamus bilaterally. Negative for hemorrhage or mass lesion.  MRA HEAD FINDINGS  Right vertebral artery is patent without stenosis. Left vertebral artery ends in PICA and does not contribute to the basilar. The basilar is patent. Superior cerebellar and posterior cerebral arteries are patent. Fetal origin of the left posterior cerebral artery with hypoplastic left P1 segment.  Internal carotid artery is patent bilaterally without stenosis. Anterior and middle cerebral arteries are widely patent.  Negative for cerebral aneurysm.  IMPRESSION: Numerous small areas of acute infarct as described above consistent with multiple small pulmonary emboli.  Atrophy and  chronic ischemic changes.  Negative intracranial MRA   Electronically Signed   By: Franchot Gallo M.D.   On: 03/15/2013 13:42    Assessment/Plan: Diagnosis: Right sided embolic infarcts 1. Does the need for close, 24 hr/day medical supervision in concert with the patient's rehab needs make it unreasonable for this patient to be served in a less intensive setting? Potentially 2. Co-Morbidities requiring supervision/potential complications: htn, rectal cancer 3. Due to bladder management, bowel management, safety, skin/wound care, disease management, medication administration and patient education, does the patient require 24 hr/day rehab nursing? Potentially 4. Does the patient require coordinated care of a physician, rehab nurse, PT (1-2 hrs/day, 5 days/week), OT (1-2 hrs/day, 5 days/week) and SLP (1-2 hrs/day, 5 days/week) to address physical and functional deficits in the context of the above medical diagnosis(es)? Yes and Potentially Addressing deficits in the following areas: balance, endurance, locomotion, strength, transferring, bowel/bladder control, bathing, dressing, feeding, grooming, toileting, cognition and psychosocial support 5. Can the patient actively participate in an intensive therapy program of at least 3 hrs of therapy per day at least 5 days per week? Potentially 6. The potential for patient to make measurable gains while on inpatient rehab is good 7. Anticipated functional outcomes upon discharge from inpatient rehab are supervision to mod I with PT, supervision to mod I with OT, supervision to mod I with SLP. 8. Estimated rehab length of stay to reach the above functional goals is: 10-12 days 9. Does the patient have adequate social supports to accommodate these discharge functional goals? Potentially 10. Anticipated D/C setting: Home 11. Anticipated post D/C treatments: Shiner therapy 12. Overall Rehab/Functional Prognosis: good  RECOMMENDATIONS: This patient's condition is  appropriate for continued rehabilitative care in the following setting: CIR Patient has agreed to participate in recommended program. No and Potentially Note that insurance prior authorization may be required for reimbursement for recommended care.  Comment: Pt stating that he "has to get home". Insight is poor however. Need to discuss situation with daughter. Will need supervision at home after a potential inpatient rehab admit. Rehab Admissions Coordinator to follow up.  Thanks,  Meredith Staggers, MD, Mell[facility] Drown     03/17/2013

## 2013-03-17 NOTE — Progress Notes (Signed)
Rehab admissions - We met with pt's daughter and son-in-law with pt's approval. Increased time spent explaining possible rehab options including stay at inpatient rehab vs SNF. Questions answered and informational brochures given. At this point, dtr and son-in-law would like to pursue inpatient rehab and would like to think about things overnight. We will plan to meet with pt and family tomorrow to clarify the plan.   Please call with any questions. Thanks.  Nanetta Batty, PT Rehabilitation Admissions Coordinator [phone]

## 2013-03-18 ENCOUNTER — Inpatient Hospital Stay (HOSPITAL_COMMUNITY)
Admission: RE | Admit: 2013-03-18 | Discharge: 2013-03-25 | DRG: 945 | Disposition: A | Discharge status: Home Health | Payer: Medicare Other | Source: Intra-hospital | Attending: Physical Medicine & Rehabilitation | Admitting: Physical Medicine & Rehabilitation

## 2013-03-18 ENCOUNTER — Encounter (HOSPITAL_COMMUNITY): Payer: Self-pay | Admitting: Physical Medicine and Rehabilitation

## 2013-03-18 ENCOUNTER — Encounter (HOSPITAL_COMMUNITY)
Admission: EM | Disposition: A | Discharge status: Rehabilitation Facility | Payer: Self-pay | Source: Home / Self Care | Attending: Internal Medicine

## 2013-03-18 DIAGNOSIS — F4320 Adjustment disorder, unspecified: Secondary | ICD-10-CM | POA: Diagnosis present

## 2013-03-18 DIAGNOSIS — I639 Cerebral infarction, unspecified: Secondary | ICD-10-CM | POA: Diagnosis present

## 2013-03-18 DIAGNOSIS — IMO0002 Reserved for concepts with insufficient information to code with codable children: Secondary | ICD-10-CM | POA: Diagnosis present

## 2013-03-18 DIAGNOSIS — I519 Heart disease, unspecified: Secondary | ICD-10-CM

## 2013-03-18 DIAGNOSIS — E785 Hyperlipidemia, unspecified: Secondary | ICD-10-CM

## 2013-03-18 DIAGNOSIS — E039 Hypothyroidism, unspecified: Secondary | ICD-10-CM | POA: Diagnosis present

## 2013-03-18 DIAGNOSIS — F3289 Other specified depressive episodes: Secondary | ICD-10-CM | POA: Diagnosis present

## 2013-03-18 DIAGNOSIS — I6340 Cerebral infarction due to embolism of unspecified cerebral artery: Secondary | ICD-10-CM | POA: Diagnosis present

## 2013-03-18 DIAGNOSIS — Z5189 Encounter for other specified aftercare: Principal | ICD-10-CM

## 2013-03-18 DIAGNOSIS — R209 Unspecified disturbances of skin sensation: Secondary | ICD-10-CM | POA: Diagnosis present

## 2013-03-18 DIAGNOSIS — I059 Rheumatic mitral valve disease, unspecified: Secondary | ICD-10-CM

## 2013-03-18 DIAGNOSIS — I10 Essential (primary) hypertension: Secondary | ICD-10-CM | POA: Diagnosis present

## 2013-03-18 DIAGNOSIS — I2510 Atherosclerotic heart disease of native coronary artery without angina pectoris: Secondary | ICD-10-CM | POA: Diagnosis present

## 2013-03-18 DIAGNOSIS — T07XXXA Unspecified multiple injuries, initial encounter: Secondary | ICD-10-CM | POA: Diagnosis present

## 2013-03-18 DIAGNOSIS — Z85038 Personal history of other malignant neoplasm of large intestine: Secondary | ICD-10-CM

## 2013-03-18 DIAGNOSIS — Z7982 Long term (current) use of aspirin: Secondary | ICD-10-CM

## 2013-03-18 DIAGNOSIS — R269 Unspecified abnormalities of gait and mobility: Secondary | ICD-10-CM | POA: Diagnosis present

## 2013-03-18 DIAGNOSIS — R197 Diarrhea, unspecified: Secondary | ICD-10-CM | POA: Diagnosis present

## 2013-03-18 DIAGNOSIS — Z79899 Other long term (current) drug therapy: Secondary | ICD-10-CM

## 2013-03-18 DIAGNOSIS — W19XXXA Unspecified fall, initial encounter: Secondary | ICD-10-CM | POA: Diagnosis present

## 2013-03-18 DIAGNOSIS — K589 Irritable bowel syndrome without diarrhea: Secondary | ICD-10-CM | POA: Diagnosis present

## 2013-03-18 DIAGNOSIS — I6350 Cerebral infarction due to unspecified occlusion or stenosis of unspecified cerebral artery: Secondary | ICD-10-CM

## 2013-03-18 DIAGNOSIS — K219 Gastro-esophageal reflux disease without esophagitis: Secondary | ICD-10-CM | POA: Diagnosis present

## 2013-03-18 DIAGNOSIS — F329 Major depressive disorder, single episode, unspecified: Secondary | ICD-10-CM | POA: Diagnosis present

## 2013-03-18 DIAGNOSIS — K5900 Constipation, unspecified: Secondary | ICD-10-CM | POA: Diagnosis present

## 2013-03-18 DIAGNOSIS — F411 Generalized anxiety disorder: Secondary | ICD-10-CM | POA: Diagnosis present

## 2013-03-18 HISTORY — PX: TEE WITHOUT CARDIOVERSION: SHX5443

## 2013-03-18 LAB — GLUCOSE, CAPILLARY
Glucose-Capillary: 101 mg/dL — ABNORMAL HIGH (ref 70–99)
Glucose-Capillary: 99 mg/dL (ref 70–99)
Glucose-Capillary: 90 mg/dL (ref 70–99)

## 2013-03-18 LAB — BASIC METABOLIC PANEL
BUN: 15 mg/dL (ref 6–23)
CALCIUM: 8.8 mg/dL (ref 8.4–10.5)
CO2: 22 meq/L (ref 19–32)
Chloride: 106 mEq/L (ref 96–112)
Creatinine, Ser: 0.69 mg/dL (ref 0.50–1.35)
GFR calc Af Amer: >90 mL/min (ref >90)
GFR, EST NON AFRICAN AMERICAN: 84 mL/min — AB (ref >90)
GLUCOSE: 80 mg/dL (ref 70–99)
Potassium: 3.8 mEq/L (ref 3.7–5.3)
Sodium: 142 mEq/L (ref 137–147)

## 2013-03-18 SURGERY — ECHOCARDIOGRAM, TRANSESOPHAGEAL
Anesthesia: Moderate Sedation

## 2013-03-18 MED ORDER — ROSUVASTATIN CALCIUM 20 MG PO TABS
20.0000 mg | ORAL_TABLET | Freq: Every day | ORAL | Status: DC
  Administered 2013-03-19 – 2013-03-25 (×7): 20 mg via ORAL
  Filled 2013-03-18 (×8): qty 1

## 2013-03-18 MED ORDER — LOSARTAN POTASSIUM 50 MG PO TABS
100.0000 mg | ORAL_TABLET | Freq: Every day | ORAL | Status: DC
  Administered 2013-03-19 – 2013-03-25 (×7): 100 mg via ORAL
  Filled 2013-03-18 (×10): qty 2

## 2013-03-18 MED ORDER — NITROGLYCERIN 0.4 MG SL SUBL: 0.4000 mg | SUBLINGUAL_TABLET | SUBLINGUAL | Status: DC | PRN

## 2013-03-18 MED ORDER — GUAIFENESIN-DM 100-10 MG/5ML PO SYRP: 5.0000 mL | ORAL_SOLUTION | Freq: Four times a day (QID) | ORAL | Status: DC | PRN

## 2013-03-18 MED ORDER — ENOXAPARIN SODIUM 40 MG/0.4ML ~~LOC~~ SOLN
40.0000 mg | SUBCUTANEOUS | Status: DC
  Administered 2013-03-18 – 2013-03-24 (×7): 40 mg via SUBCUTANEOUS
  Filled 2013-03-18 (×8): qty 0.4

## 2013-03-18 MED ORDER — BISACODYL 10 MG RE SUPP: 10.0000 mg | Freq: Every day | RECTAL | Status: DC | PRN

## 2013-03-18 MED ORDER — FENTANYL CITRATE 0.05 MG/ML IJ SOLN
INTRAMUSCULAR | Status: DC | PRN
  Administered 2013-03-18 (×2): 25 ug via INTRAVENOUS

## 2013-03-18 MED ORDER — LEVOTHYROXINE SODIUM 25 MCG PO TABS
25.0000 ug | ORAL_TABLET | Freq: Every day | ORAL | Status: DC
  Administered 2013-03-19 – 2013-03-25 (×7): 25 ug via ORAL
  Filled 2013-03-18 (×9): qty 1

## 2013-03-18 MED ORDER — PROCHLORPERAZINE MALEATE 5 MG PO TABS
5.0000 mg | ORAL_TABLET | Freq: Four times a day (QID) | ORAL | Status: DC | PRN
  Filled 2013-03-18: qty 2

## 2013-03-18 MED ORDER — AMLODIPINE BESYLATE 10 MG PO TABS
10.0000 mg | ORAL_TABLET | Freq: Every day | ORAL | Status: DC
  Administered 2013-03-19 – 2013-03-25 (×7): 10 mg via ORAL
  Filled 2013-03-18 (×8): qty 1

## 2013-03-18 MED ORDER — DIPHENHYDRAMINE HCL 12.5 MG/5ML PO ELIX: 12.5000 mg | ORAL_SOLUTION | Freq: Four times a day (QID) | ORAL | Status: DC | PRN

## 2013-03-18 MED ORDER — ALUM & MAG HYDROXIDE-SIMETH 200-200-20 MG/5ML PO SUSP
30.0000 mL | ORAL | Status: DC | PRN
Start: 2013-03-18 — End: 2013-03-25

## 2013-03-18 MED ORDER — TRIAMCINOLONE 0.1 % CREAM:EUCERIN CREAM 1:1
1.0000 "application " | TOPICAL_CREAM | Freq: Three times a day (TID) | CUTANEOUS | Status: DC | PRN
  Filled 2013-03-18: qty 1

## 2013-03-18 MED ORDER — SODIUM CHLORIDE 0.9 % IV SOLN
INTRAVENOUS | Status: DC
  Administered 2013-03-18: 500 mL via INTRAVENOUS

## 2013-03-18 MED ORDER — PROCHLORPERAZINE 25 MG RE SUPP
12.5000 mg | Freq: Four times a day (QID) | RECTAL | Status: DC | PRN
  Filled 2013-03-18: qty 1

## 2013-03-18 MED ORDER — MIDAZOLAM HCL 5 MG/ML IJ SOLN
INTRAMUSCULAR | Status: AC
  Filled 2013-03-18: qty 2

## 2013-03-18 MED ORDER — PROCHLORPERAZINE EDISYLATE 5 MG/ML IJ SOLN
5.0000 mg | Freq: Four times a day (QID) | INTRAMUSCULAR | Status: DC | PRN
  Filled 2013-03-18: qty 2

## 2013-03-18 MED ORDER — FENTANYL CITRATE 0.05 MG/ML IJ SOLN
INTRAMUSCULAR | Status: AC
  Filled 2013-03-18: qty 2

## 2013-03-18 MED ORDER — FLEET ENEMA 7-19 GM/118ML RE ENEM: 1.0000 | ENEMA | Freq: Once | RECTAL | Status: AC | PRN

## 2013-03-18 MED ORDER — DILTIAZEM HCL ER COATED BEADS 240 MG PO CP24
240.0000 mg | ORAL_CAPSULE | Freq: Every day | ORAL | Status: DC
  Administered 2013-03-19 – 2013-03-25 (×7): 240 mg via ORAL
  Filled 2013-03-18 (×9): qty 1

## 2013-03-18 MED ORDER — ACETAMINOPHEN 325 MG PO TABS
325.0000 mg | ORAL_TABLET | ORAL | Status: DC | PRN
  Administered 2013-03-19: 650 mg via ORAL
  Filled 2013-03-18: qty 2

## 2013-03-18 MED ORDER — ASPIRIN 325 MG PO TABS
325.0000 mg | ORAL_TABLET | Freq: Every day | ORAL | Status: DC
  Administered 2013-03-19 – 2013-03-25 (×7): 325 mg via ORAL
  Filled 2013-03-18 (×8): qty 1

## 2013-03-18 MED ORDER — SENNOSIDES-DOCUSATE SODIUM 8.6-50 MG PO TABS
1.0000 | ORAL_TABLET | Freq: Every evening | ORAL | Status: DC | PRN
  Administered 2013-03-18 – 2013-03-20 (×2): 1 via ORAL
  Filled 2013-03-18 (×2): qty 1

## 2013-03-18 MED ORDER — SERTRALINE HCL 25 MG PO TABS
25.0000 mg | ORAL_TABLET | Freq: Every day | ORAL | Status: DC
  Administered 2013-03-19 – 2013-03-23 (×5): 25 mg via ORAL
  Filled 2013-03-18 (×8): qty 1

## 2013-03-18 MED ORDER — TRAZODONE HCL 50 MG PO TABS
25.0000 mg | ORAL_TABLET | Freq: Every evening | ORAL | Status: DC | PRN
  Administered 2013-03-19 – 2013-03-24 (×6): 50 mg via ORAL
  Filled 2013-03-18 (×7): qty 1

## 2013-03-18 MED ORDER — MIDAZOLAM HCL 10 MG/2ML IJ SOLN
INTRAMUSCULAR | Status: DC | PRN
  Administered 2013-03-18 (×2): 2 mg via INTRAVENOUS

## 2013-03-18 MED ORDER — BUTAMBEN-TETRACAINE-BENZOCAINE 2-2-14 % EX AERO
INHALATION_SPRAY | CUTANEOUS | Status: DC | PRN
  Administered 2013-03-18: 2 via TOPICAL

## 2013-03-18 NOTE — PMR Pre-admission (Signed)
PMR Admission Coordinator Pre-Admission Assessment  Patient: Alexander Johnston is an 80 y.o., male MRN: [id number] DOB: 1924/06/06 Height: 6\' 2"  (188 cm) Weight: 88.27 kg (194 lb 9.6 oz)              Insurance Information HMO:     PPO:      PCP:      IPA:      80/20:      OTHER:  PRIMARY: Medicare A & B      Policy#: [id number]      Subscriber: self  CM Name:       Phone#:      Fax#:  Pre-Cert#: verified in Architect: retired Benefits:  Phone #:      Name:  Eff. Date: 11-14-91     Deduct: $1260      Out of Pocket Max: none      Life Max: unlimited CIR: 100%      SNF: 100% days 1-20, 80% days 21-100 (100 day limit) Outpatient: 80%     Co-Pay: 20% Home Health: 100%       Co-Pay:  None (no visit limits) DME: 80%     Co-Pay: 20% Providers:  Pt's preference  SECONDARY: [employer]      Policy#: [id number]      Subscriber: self CM Name:      Phone#:      Fax#:  Pre-Cert#:       Employer:  Benefits:  Phone #: [phone]     Name:  Eff. Date:      Deduct:       Out of Pocket Max:       Life Max:  CIR:       SNF:  Outpatient:      Co-Pay:  Home Health:       Co-Pay:  DME:      Co-Pay:   Emergency Contact Information Contact Information   Name Relation Home Work [place] Daughter [phone]  [phone]     Current Medical History  Patient Admitting Diagnosis: Right sided embolic infarcts  History of Present Illness: Alexander Johnston is a 81 y.o. male with history of CAD, IBS, CVA, who was admitted on 03/15/13 with left sided numbness, fall and difficulty speaking. Patient reported to have similar symptoms few days before that resolved on its own. MRI and MRA brain multiple small areas of acute infarct affecting  right cerebellum right medulla, superior   cerebellar vermis, right temporal lobe, right frontal and parietal lobe, right occipital lobe, right splenium of the corpus callosum as well as left frontal and parietal lobe. 2D echo with EF 50-55%, grade 1  diastolic dysfunction and no wall abnormality. Carotid dopplers without significant stenosis. Neurology consulted and recommended changing ASA to 325 mg/dailly as well as TEE for work up of embolic source with unknown source. PT/OT evaluations done yesterday and CIR recommended. Patient with resultant mild dysmetria LUE, mild left hemiparesis with decreased sensation LUE, as well as ataxic gait with multiple episodes of  LOB with ambulation. PT, OT, MD recommending CIR.   NIH Total: 0  Past Medical History  Past Medical History  Diagnosis Date  . CAD (coronary artery disease)   . Hypertension   . Stroke   . GERD (gastroesophageal reflux disease)   . Diarrhea     intermittent past 4 years  . Colon cancer 10/17/12    adenocarcinoma  . IBS (irritable bowel syndrome)   .  Hypothyroidism   . Abrasion of great toe of right foot   . Abrasion of second toe, right   . Abrasion of right lower leg     Family History  family history includes Cancer in his daughter; Diabetes in his brother; Heart disease in his mother; Hypertension in his brother.  Prior Rehab/Hospitalizations: pt has a visiting MD per dtr but has not had any home services/previous hospitalizations.   Current Medications  Current facility-administered medications:[MAR HOLD] acetaminophen (TYLENOL) tablet 650 mg, 650 mg, Oral, Q6H PRN, Dianne Dun, NP, 650 mg at 03/17/13 1524;  [MAR HOLD] amLODipine (NORVASC) tablet 10 mg, 10 mg, Oral, Daily, Berle Mull, MD, 10 mg at 03/18/13 1042;  [MAR HOLD] aspirin tablet 325 mg, 325 mg, Oral, Daily, Berle Mull, MD, 325 mg at 03/18/13 1042 [MAR HOLD] diltiazem (CARDIZEM CD) 24 hr capsule 240 mg, 240 mg, Oral, Daily, Berle Mull, MD, 240 mg at 03/18/13 1042;  [MAR HOLD] enoxaparin (LOVENOX) injection 40 mg, 40 mg, Subcutaneous, Q24H, Berle Mull, MD, 40 mg at 03/18/13 1048;  [MAR HOLD] levothyroxine (SYNTHROID, LEVOTHROID) tablet 25 mcg, 25 mcg, Oral, QAC breakfast, Berle Mull, MD,  25 mcg at 03/18/13 1042 [MAR HOLD] losartan (COZAAR) tablet 100 mg, 100 mg, Oral, Daily, Berle Mull, MD, 100 mg at 03/18/13 1043;  [MAR HOLD] nitroGLYCERIN (NITROSTAT) SL tablet 0.4 mg, 0.4 mg, Sublingual, Q5 min PRN, Berle Mull, MD;  [facility] HOLD] rosuvastatin (CRESTOR) tablet 20 mg, 20 mg, Oral, Daily, Berle Mull, MD, 20 mg at 03/18/13 1042;  [MAR HOLD] sertraline (ZOLOFT) tablet 25 mg, 25 mg, Oral, Daily, Berle Mull, MD, 25 mg at 03/18/13 1042 [facility] HOLD] triamcinolone 0.1 % cream : eucerin cream, 1:1 1 application, 1 application, Topical, TID PRN, Eugenie Filler, MD, 1 application at 41/66/06 2227  Patients Current Diet: heart healthy  Precautions / Restrictions Precautions Precautions: Fall Restrictions Weight Bearing Restrictions: No   Prior Activity Level Community (5-7x/wk): pt was driving, runs errands & visits his wife (who is in dementia unit). 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  Home Assistive Devices / Equipment Home Assistive Devices/Equipment: Eyeglasses;Cane (specify quad or straight) Home Equipment: Grab bars - toilet;Cane - single point  Prior Functional Level Prior Function Level of Independence: Independent with assistive device(s) Comments: cane  Current Functional Level Cognition  Overall Cognitive Status: Impaired/Different from baseline Orientation Level: Oriented X4 Safety/Judgement: Decreased awareness of safety    Extremity Assessment (includes Sensation/Coordination)          ADLs  Grooming: Wash/dry hands;Teeth care;Minimal assistance (balance) Where Assessed - Grooming: Supported standing Lower Body Bathing: Min guard;Minimal assistance (balance; washed peri area) Where Assessed - Lower Body Bathing: Supported standing Upper Body Dressing: Set up Where Assessed - Upper Body Dressing: Supported  sitting Lower Body Dressing: Minimal assistance Where Assessed - Lower Body Dressing: Supported sit to Lobbyist: Minimal Print production planner Method: Sit to Loss adjuster, chartered: Comfort height toilet;Grab bars;Regular height toilet Toileting - Clothing Manipulation and Hygiene: Min guard Where Assessed - Best boy and Hygiene: Standing Tub/Shower Transfer Method: Not assessed Equipment Used: Gait belt;Rolling walker Transfers/Ambulation Related to ADLs: Mod A for ambulation-pt running into things with walker. Min guard/Min A for transfers. ADL Comments: Recommended sitting for bathing and dressing.     Mobility  Overal bed mobility: Needs Assistance Bed Mobility: Sit to Supine Sit  to supine: Supervision    Transfers  Overall transfer level: Needs assistance Equipment used: Rolling walker (2 wheeled) Transfers: Sit to/from Stand Sit to Stand: Min guard General transfer comment: Cues for technique.    Ambulation / Gait / Stairs / Wheelchair Mobility  Ambulation/Gait Ambulation Distance (Feet): 180 Feet Gait velocity: decreased General Gait Details: Patient significantly unsteady with ambulation regardless of assistive device use. Patient constantly staggering to the left with ataxic like gait. Had 3 incidents of complete assist to prevent falling over and 1 incident where patietn fell into the wall while attempting to ambulate. Patient continuously running into objects (mainly on left but did happen occassionaly on the right as well). Unsafe to ambulate without physical assist.    Posture / Balance High Level Balance High Level Balance Comments: patient very unsteady requiring mod to max assist for high level balance deficits.    Special needs/care consideration BiPAP/CPAP no CPM no Continuous Drip IV no Dialysis no         Life Vest no  Oxygen no Special Bed no Trach Size no  Wound Vac (area) no       Skin - pt with hx of  eczema and Athlete's foot                               Bowel mgmt: last BM on 03-17-13 Bladder mgmt: using BSC, dtr reports that pt wears Depends at baseline Diabetic mgmt no   Previous Home Environment Living Arrangements: Alone Available Help at Discharge: Family;Available PRN/intermittently Type of Home: Apartment Home Layout: One level Home Access: Level entry Bathroom Shower/Tub: Health visitorWalk-in shower Bathroom Toilet: Standard Home Care Services: No Additional Comments: patient with poor recognition of deficits, continues to state that he can walk just fine and that he has fallen before and is real careful.  Discharge Living Setting Does the patient have any problems obtaining your medications?: No  Note: dtr/son-in-law shared that they prefer for pt NOT to return to his apartment (which is an apartment attached to a business). Pt has been at Assisted Living before but "signed himself out/alienated himself" and moved to his current apartment.   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 living/AL at [place]) Anticipated Caregiver's Contact Information: see above for Celeste's number Ability/Limitations of Caregiver: dtr and son-in-law indicated they would get ind. 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 Dietary Needs: heart healthy Equipment Needs: to be determined Special Service Needs:  (Dtr shared that 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) Additional Information: Note: pt's dtr states that Pt can be "difficult" (very determined and stubborn about returning home) Pt/Family Agrees to Admission and willing to participate: Yes Program Orientation Provided & Reviewed with Pt/Caregiver Including Roles  & Responsibilities: Yes  Decrease burden  of Care through IP rehab admission: NA   Possible need for SNF placement upon discharge: not anticipated    Patient Condition: This patient's condition remains as documented in the consult dated 03/17/13, in which the Rehabilitation Physician determined and documented that the patient's condition is appropriate for intensive rehabilitative care in an inpatient rehabilitation facility. Will admit to inpatient rehab today.  Preadmission Screen Completed By:  Nanetta Batty, PT2/04/2013 1:10 PM ______________________________________________________________________   Discussed status with Dr. Naaman Plummer on 03/18/13 at 1332 and received telephone approval for admission today.  Admission Coordinator:  Sherlyn Hay Lorey Pallett,PT time 1332/Date 03/18/13

## 2013-03-18 NOTE — Progress Notes (Signed)
OT Cancellation Note  Patient Details Name: Alexander Johnston MRN: [id number] DOB: [birth year]   Cancelled Treatment:    Reason Eval/Treat Not Completed: Patient at procedure or test/ unavailable  Lucille Passy, OTR/L [phone]  03/18/2013, 3:33 PM

## 2013-03-18 NOTE — Interval H&P Note (Signed)
History and Physical Interval Note:  03/18/2013 1:34 PM  Alexander Johnston  has presented today for surgery, with the diagnosis of Stroke [434.91]  The various methods of treatment have been discussed with the patient and family. After consideration of risks, benefits and other options for treatment, the patient has consented to  Procedure(s): TRANSESOPHAGEAL ECHOCARDIOGRAM (TEE) (N/A) as a surgical intervention .  The patient's history has been reviewed, patient examined, no change in status, stable for surgery.  I have reviewed the patient's chart and labs.  Questions were answered to the patient's satisfaction.     Tiandra Swoveland [employer]

## 2013-03-18 NOTE — Interval H&P Note (Signed)
Alexander Johnston was admitted today to Inpatient Rehabilitation with the diagnosis of right brain CVA's.  The patient's history has been reviewed, patient examined, and there is no change in status.  Patient continues to be appropriate for intensive inpatient rehabilitation.  I have reviewed the patient's chart and labs.  Questions were answered to the patient's satisfaction.  SWARTZ,ZACHARY T 03/18/2013, 10:38 PM

## 2013-03-18 NOTE — Progress Notes (Signed)
Physical Therapy Treatment Patient Details Name: BABAK LUCUS MRN: [id number] DOB: [birth year] Today's Date: 03/18/2013   PT Cancellation Note  Patient Details Name: NAHUN KRONBERG MRN: [id number] DOB: 1924-07-15   Cancelled Treatment:    Reason Eval/Treat Not Completed: Patient at procedure or test/unavailable   Duncan Dull 03/18/2013, 1:19 PM Alben Deeds, [place] DPT  [phone]

## 2013-03-18 NOTE — Progress Notes (Signed)
Rehab admissions - Met with patient.  He says he is scheduled for TEE today.  RN says TEE for 1:30 pm today.  If TEE is unrevealing and gets completed before 3 PM today, we can potentially admit to acute inpatient rehab today.  Call me for questions.  #[phone]

## 2013-03-18 NOTE — CV Procedure (Signed)
Procedure: TEE  Indication: CVA  Sedation: Versed 4 mg IV, Fentanyl 50 mcg IV  Findings: Please see echo section for full report.  Normal LV size with mild LV hypertrophy.  EF 55%.  Normal RV size and systolic function.  Mild MR.  No LAA thrombus.  Mild LAE.  No PFO/ASD.  Grade III plaque descending thoracic aorta and arch.    No source for embolus.   Loralie Champagne 03/18/2013 1:49 PM

## 2013-03-18 NOTE — Progress Notes (Signed)
Rehab admissions - Patient has been medically cleared and we will admit to acute inpatient rehab today.  Call me for questions.  #[phone]

## 2013-03-18 NOTE — Progress Notes (Signed)
  Echocardiogram Echocardiogram Transesophageal has been performed.  [place], [place] 03/18/2013, 1:56 PM

## 2013-03-18 NOTE — Progress Notes (Signed)
Pt arrived from 4N via wheelchair accompanied by nurse and tech. Pt awake, alert, oriented. Pt in no apparent discomfort or distress. Pt ambulated with minimal assist to the bed. Pt settled in bed, oriented to the room. Bed low and locked, call light and phone within reach. Dietary called to get supper tray. Pt denies pain at this time. Bed alarm on. Will check orders. Will continue to monitor. - Soyla Dryer, RN

## 2013-03-18 NOTE — H&P (Signed)
Physical Medicine and Rehabilitation Admission H&P    Chief Complaint  Patient presents with  . Left sided numbness, speech difficulty, confusion.    HPI: Alexander Johnston is a 80 y.o. male with history of CAD, IBS, CVA, who was admitted on 03/15/13 with left sided numbness, fall and difficulty speaking. Patient reported to have similar symptoms few days before that resolved on its own. MRI and MRA brain multiple small areas of acute infarct affecting right cerebellum right medulla, superior  cerebellar vermis, right temporal lobe, right frontal and parietal lobe, right occipital lobe, right splenium of the corpus callosum as well as left frontal and parietal lobe. 2D echo with EF 35-68%, grade 1 diastolic dysfunction and no wall abnormality. Carotid dopplers without significant stenosis. Neurology consulted and recommended changing ASA to 325 mg/daily as well as TEE for work up of embolic source.   Patient agreeable to have TEE done today and negative for LAA thrombus, PFO/ASD and EF 55%. Grade III plaque descending thoracic aorta and arch.  Patient with resultant mild dysmetria LUE, mild left hemiparesis with decreased sensation LUE, as well as ataxic gait with multiple episodes of LOB with ambulation. Noted to have poor insight, lack of awareness into current deficits as well as tendency to run into objects on left. Rehab team recommending CIR and patient admitted today.  CIR.    Review of Systems  Eyes: Negative for blurred vision and double vision.  Respiratory: Negative for cough and shortness of breath.   Cardiovascular: Negative for chest pain and leg swelling.  Gastrointestinal: Positive for constipation. Negative for heartburn.  Genitourinary: Positive for frequency.  Musculoskeletal: Positive for falls. Negative for back pain and myalgias.  Neurological: Positive for sensory change (LLE). Negative for headaches.   Past Medical History  Diagnosis Date  . CAD (coronary artery  disease)   . Hypertension   . Stroke   . GERD (gastroesophageal reflux disease)   . Diarrhea     intermittent past 4 years  . Colon cancer 10/17/12    adenocarcinoma  . IBS (irritable bowel syndrome)   . Hypothyroidism   . Abrasion of great toe of right foot   . Abrasion of second toe, right   . Abrasion of right lower leg    Past Surgical History  Procedure Laterality Date  . Knee surgery      bilateral  . Colonoscopy w/ biopsies  10/17/12    rectosigmoid=adenocarcinoma  . Eus N/A 11/07/2012    Procedure: LOWER ENDOSCOPIC ULTRASOUND (EUS);  Surgeon: Milus Banister, MD;  Location: [facility];  Service: Endoscopy;  Laterality: N/A;   Family History  Problem Relation Age of Onset  . Heart disease Mother     "all her family"  . Diabetes Brother   . Hypertension Brother   . Cancer Daughter     choriocarricinoma   Social History:  Lives alone.  Per reports that he has never smoked. Has home MD visits.  He has never used smokeless tobacco. He reports that he does not drink alcohol or use illicit drugs.   Allergies: No Known Allergies  Medications Prior to Admission  Medication Sig Dispense Refill  . amLODipine (NORVASC) 10 MG tablet Take 10 mg by mouth daily.      Marland Kitchen aspirin 81 MG chewable tablet Chew 162 mg by mouth once.      . diltiazem (CARDIZEM CD) 240 MG 24 hr capsule Take 240 mg by mouth daily.      Marland Kitchen  hydrochlorothiazide (HYDRODIURIL) 25 MG tablet Take 12.5 mg by mouth daily.      Marland Kitchen levothyroxine (SYNTHROID, LEVOTHROID) 25 MCG tablet Take 25 mcg by mouth daily before breakfast.      . loperamide (IMODIUM) 2 MG capsule Take 2 mg by mouth 2 (two) times daily.      Marland Kitchen losartan (COZAAR) 100 MG tablet Take 100 mg by mouth daily.      . Multiple Vitamin (ONE-A-DAY MENS PO) Take 1 tablet by mouth daily.      . nitroGLYCERIN (NITROSTAT) 0.4 MG SL tablet Place 0.4 mg under the tongue every 5 (five) minutes as needed for chest pain.      Marland Kitchen OVER THE COUNTER MEDICATION Take 2-3  sprays by mouth daily as needed ("essential oils" spray).      . pentoxifylline (TRENTAL) 400 MG CR tablet Take 400 mg by mouth 3 (three) times daily with meals.      . sertraline (ZOLOFT) 50 MG tablet Take 50 mg by mouth daily.      . metoprolol (TOPROL-XL) 200 MG 24 hr tablet Take 200 mg by mouth daily.      . rosuvastatin (CRESTOR) 40 MG tablet Take 40 mg by mouth daily.        Home: Home Living Family/patient expects to be discharged to:: Private residence Living Arrangements: Alone Available Help at Discharge: Family;Available PRN/intermittently Type of Home: Apartment Home Access: Level entry Home Layout: One level Home Equipment: Grab bars - toilet;Cane - single point Additional Comments: patient with poor recognition of deficits, continues to state that he can walk just fine and that he has fallen before and is real careful.   Functional History: Prior Function Comments: cane  Functional Status:  Mobility:     Ambulation/Gait Ambulation Distance (Feet): 180 Feet Gait velocity: decreased General Gait Details: Patient significantly unsteady with ambulation regardless of assistive device use. Patient constantly staggering to the left with ataxic like gait. Had 3 incidents of complete assist to prevent falling over and 1 incident where patietn fell into the wall while attempting to ambulate. Patient continuously running into objects (mainly on left but did happen occassionaly on the right as well). Unsafe to ambulate without physical assist.    ADL: ADL Grooming: Wash/dry hands;Teeth care;Minimal assistance (balance) Where Assessed - Grooming: Supported standing Lower Body Bathing: Min guard;Minimal assistance (balance; washed peri area) Where Assessed - Lower Body Bathing: Supported standing Upper Body Dressing: Set up Where Assessed - Upper Body Dressing: Supported sitting Lower Body Dressing: Minimal assistance Where Assessed - Lower Body Dressing: Supported sit to  Lobbyist: Minimal Print production planner Method: Sit to Loss adjuster, chartered: Comfort height toilet;Grab bars;Regular height toilet Tub/Shower Transfer Method: Not assessed Equipment Used: Gait belt;Rolling walker Transfers/Ambulation Related to ADLs: Mod A for ambulation-pt running into things with walker. Min guard/Min A for transfers. ADL Comments: Recommended sitting for bathing and dressing.   Cognition: Cognition Overall Cognitive Status: Impaired/Different from baseline Orientation Level: Oriented X4 Cognition Arousal/Alertness: Awake/alert Behavior During Therapy: WFL for tasks assessed/performed Overall Cognitive Status: Impaired/Different from baseline Area of Impairment: Safety/judgement;Problem solving Safety/Judgement: Decreased awareness of safety Problem Solving: Slow processing;Requires verbal cues;Requires tactile cues;Difficulty sequencing   Blood pressure 148/75, pulse 66, temperature 98.1 F (36.7 C), temperature source Oral, resp. rate 20, height 6' 2"  (1.88 m), weight 88.27 kg (194 lb 9.6 oz), SpO2 100.00%.   Physical Exam Constitutional: He appears fairly well-developed and well-nourished.  HENT: oral mucosa pink and moist Head: Normocephalic  and atraumatic.  Eyes: Conjunctivae and EOM are normal. Pupils are equal, round, and reactive to light.  Neck: No JVD present. No tracheal deviation present. No thyromegaly present.  Cardiovascular: Normal rate. No appreciable murmur.  Respiratory: No respiratory distress. No wheezes rales or rhonchi GI: He exhibits no distension. There is no tenderness. Bowel sounds are positive.  Lymphadenopathy:  He has no cervical adenopathy.  Neurological:  Pt sedated after procedure. Moves all 4's fairly equally when awakened. Decreased fine motor on the left but inconsistent. Left inattention. No left to right sensory differences. No resting tone. Coordination fair to good. Decreased insight and  awareness. No CN abnl.  Skin:  Breakdown over right 1st two toes and along medial ankle still present. Appears to be partly from scratching.  Psychiatric:  sedated   Results for orders placed during the hospital encounter of 03/14/13 (from the past 48 hour(s))  GLUCOSE, CAPILLARY     Status: Abnormal   Collection Time    03/16/13  4:44 PM      Result Value Range   Glucose-Capillary 128 (*) 70 - 99 mg/dL  GLUCOSE, CAPILLARY     Status: Abnormal   Collection Time    03/16/13  8:58 PM      Result Value Range   Glucose-Capillary 121 (*) 70 - 99 mg/dL   Comment 1 Documented in Chart     Comment 2 Notify RN    GLUCOSE, CAPILLARY     Status: None   Collection Time    03/17/13  6:18 AM      Result Value Range   Glucose-Capillary 92  70 - 99 mg/dL   Comment 1 Documented in Chart     Comment 2 Notify RN    BASIC METABOLIC PANEL     Status: Abnormal   Collection Time    03/17/13  6:44 AM      Result Value Range   Sodium 140  137 - 147 mEq/L   Potassium 3.8  3.7 - 5.3 mEq/L   Chloride 105  96 - 112 mEq/L   CO2 23  19 - 32 mEq/L   Glucose, Bld 99  70 - 99 mg/dL   BUN 19  6 - 23 mg/dL   Creatinine, Ser 0.78  0.50 - 1.35 mg/dL   Calcium 8.5  8.4 - 10.5 mg/dL   GFR calc non Af Amer 79 (*) >90 mL/min   GFR calc Af Amer >90  >90 mL/min   Comment: (NOTE)     The eGFR has been calculated using the CKD EPI equation.     This calculation has not been validated in all clinical situations.     eGFR's persistently <90 mL/min signify possible Chronic Kidney     Disease.  GLUCOSE, CAPILLARY     Status: Abnormal   Collection Time    03/17/13 11:33 AM      Result Value Range   Glucose-Capillary 102 (*) 70 - 99 mg/dL   Comment 1 Notify RN     Comment 2 Documented in Chart    GLUCOSE, CAPILLARY     Status: Abnormal   Collection Time    03/17/13  5:03 PM      Result Value Range   Glucose-Capillary 107 (*) 70 - 99 mg/dL   Comment 1 Notify RN     Comment 2 Documented in Chart    GLUCOSE,  CAPILLARY     Status: Abnormal   Collection Time    03/17/13  9:11 PM  Result Value Range   Glucose-Capillary 120 (*) 70 - 99 mg/dL  BASIC METABOLIC PANEL     Status: Abnormal   Collection Time    03/18/13  4:15 AM      Result Value Range   Sodium 142  137 - 147 mEq/L   Potassium 3.8  3.7 - 5.3 mEq/L   Chloride 106  96 - 112 mEq/L   CO2 22  19 - 32 mEq/L   Glucose, Bld 80  70 - 99 mg/dL   BUN 15  6 - 23 mg/dL   Creatinine, Ser 0.69  0.50 - 1.35 mg/dL   Calcium 8.8  8.4 - 10.5 mg/dL   GFR calc non Af Amer 84 (*) >90 mL/min   GFR calc Af Amer >90  >90 mL/min   Comment: (NOTE)     The eGFR has been calculated using the CKD EPI equation.     This calculation has not been validated in all clinical situations.     eGFR's persistently <90 mL/min signify possible Chronic Kidney     Disease.  GLUCOSE, CAPILLARY     Status: Abnormal   Collection Time    03/18/13  6:45 AM      Result Value Range   Glucose-Capillary 101 (*) 70 - 99 mg/dL  GLUCOSE, CAPILLARY     Status: None   Collection Time    03/18/13 11:27 AM      Result Value Range   Glucose-Capillary 99  70 - 99 mg/dL   Comment 1 Notify RN     Comment 2 Documented in Chart     No results found.  Post Admission Physician Evaluation: 1. Functional deficits secondary  to multiple distribution right brain infarcts, ? Embolic source. 2. Patient is admitted to receive collaborative, interdisciplinary care between the physiatrist, rehab nursing staff, and therapy team. 3. Patient's level of medical complexity and substantial therapy needs in context of that medical necessity cannot be provided at a lesser intensity of care such as a SNF. 4. Patient has experienced substantial functional loss from his/her baseline which was documented above under the "Functional History" and "Functional Status" headings.  Judging by the patient's diagnosis, physical exam, and functional history, the patient has potential for functional progress which  will result in measurable gains while on inpatient rehab.  These gains will be of substantial and practical use upon discharge  in facilitating mobility and self-care at the household level. 5. Physiatrist will provide 24 hour management of medical needs as well as oversight of the therapy plan/treatment and provide guidance as appropriate regarding the interaction of the two. 6. 24 hour rehab nursing will assist with bladder management, bowel management, safety, skin/wound care, disease management, medication administration, pain management and patient education  and help integrate therapy concepts, techniques,education, etc. 7. PT will assess and treat for/with: Lower extremity strength, range of motion, stamina, balance, functional mobility, safety, adaptive techniques and equipment, NMR, patient edcuation.   Goals are: mod I to supervision. 8. OT will assess and treat for/with: ADL's, functional mobility, safety, upper extremity strength, adaptive techniques and equipment, NMR, patient and family education.   Goals are: mod I to supervision. 9. SLP will assess and treat for/with: cognitive awareness, communication, safety awareness.  Goals are: supervision  10. Case Management and Social Worker will assess and treat for psychological issues and discharge planning. 11. Team conference will be held weekly to assess progress toward goals and to determine barriers to discharge. 12. Patient will receive at  least 3 hours of therapy per day at least 5 days per week. 13. ELOS: 7-10 days       14. Prognosis:  excellent   Medical Problem List and Plan: 1. DVT Prophylaxis/Anticoagulation: Pharmaceutical: Lovenox 2. Pain Management: prn tylenol. 3. Mood:  Improvement in anxiety levels today.  Provide ego support. LCSW to follow for evaluation and support.  4. Neuropsych: This patient is not capable of making decisions on his own behalf. 5. Multiple abrasions: right lower leg, left shin and well as Right  1st and 2nd toes.  6. Colon cancer: History of intermittent diarrhea.  7. HTN: Will monitor with bid checks. Continue Cardizem, Norvasc and Losartan.  8. Dyslipidemia: continue Crestor.    Meredith Staggers, MD, [facility]   03/18/2013

## 2013-03-18 NOTE — Discharge Summary (Signed)
Physician Discharge Summary  Alexander Johnston [id number] DOB: 25-Dec-1924 DOA: 03/14/2013  PCP: Alexander Poll, MD  Admit date: 03/14/2013 Discharge date: 03/18/2013  Time spent: 65 minutes  Recommendations for Outpatient Follow-up:  1. Followup with Dr. Leonie Man of neurology 2 months post discharge. 2. Followup with Alexander Poll, MD 1-2 weeks postdischarge. Will followup patient blood pressure on it. Assessment as well as risk factor modification will need to be done.  Discharge Diagnoses:  Principal Problem:   CVA (cerebral infarction) Active Problems:   HTN (hypertension)   Other and unspecified hyperlipidemia   Rectal cancer   Diarrhea   Discharge Condition: Stable and improved  Diet recommendation: Heart healthy  Filed Weights   03/15/13 0510  Weight: 88.27 kg (194 lb 9.6 oz)    History of present illness:  Alexander Johnston is a 80 y.o. male with Past medical history of coronary artery disease, hypertension, GERD, TIA, hypothyroidism, colon cancer.  The patient is coming from home.  The patient presented with complain of left-sided numbness. He mentions the symptoms started at around 3:00 when he woke from sleep in the afternoon. He mentions that he had similar symptoms which resolved on its own on Wednesday when he was driving to see his wife.  He denies any similar episodes in past, denies any trauma or fall, denies any syncopal episode, denies any visual disturbances.  Pt denies any fever, chills, headache, cough, chest pain, palpitation, shortness of breath, orthopnea, PND, nausea, vomiting, abdominal pain, diarrhea, constipation, active bleeding, burning urination, dizziness, pedal edema, other focal neurological deficit.    Hospital Course:  #1 acute CVA  Patient was admitted with episode of left-sided numbness and gait instability with a fall and difficulty with language. Patient was subsequently admitted to the hospital placed on telemetry and a stroke workup undertaken.  Neurology was also consulted and followed the patient throughout the hospitalization. Due to time of presentation patient was deemed not a TPA candidate. CT head which was done was negative for any acute abnormalities.  MRI head showed multiple areas of acute infarct consistent with emboli. Patient was on aspirin 81 mg prior to admission and subsequently placed on full dose aspirin for secondary stroke prevention. Fasting lipid panel with LDL of 86 and patient was on a statin prior to admission and maintained on a statin during the hospitalization. 2-D echo with no source of emboli. Carotid Dopplers preliminary readings with no significant ICA stenosis. Patient initially refused TEE, however after procedure was explained patient agreed to a TEE which was done on 03/18/2013 which showed an EF of 55%, mild LV hypertrophy, mild mitral regurgitation, normal left atrial thrombus, mild left atrial enlargement, no PFO/ASD, grade 3 plaque in the descending thoracic aorta and arch. Patient was subsequently deemed an appropriate candidate for inpatient rehabilitation and will be discharged to inpatient rehabilitation today. Patient will followup with neurology 2 months post discharge.   #2 hyperlipidemia  LDL of 86. Continued on statin.  #3 hypertension  Stable. Continued on Norvasc and Cardizem and Cozaar.  #4 hypothyroidism  Continued on Synthroid.  #5 coronary artery disease  Stable. Continued on Norvasc, Cardizem, Cozaar, aspirin, Crestor.      Procedures: CT at 03/14/2013  MRI MRA head 03/15/2013  2-D echo 03/15/2013  Carotid Dopplers 03/15/2013 EEG 03/18/13  Consultations: Neurology: Dr. Doy Mince 03/15/2013   Discharge Exam: Filed Vitals:   03/18/13 1430  BP: 116/61  Pulse: 55  Temp:   Resp: 15    General: NAD Cardiovascular: RRR  Respiratory: CTAB  Discharge Instructions       Future Appointments Provider Department Dept Phone   03/24/2013 2:00 PM [name], MD [facility], [place]   05/30/2013 9:45 AM [name] [facility] [phone]   05/30/2013 10:15 AM [name], MD [facility] [phone]       Medication List    STOP taking these medications       aspirin 81 MG chewable tablet      TAKE these medications       amLODipine 10 MG tablet  Commonly known as:  NORVASC  Take 10 mg by mouth daily.     diltiazem 240 MG 24 hr capsule  Commonly known as:  CARDIZEM CD  Take 240 mg by mouth daily.     hydrochlorothiazide 25 MG tablet  Commonly known as:  HYDRODIURIL  Take 12.5 mg by mouth daily.     levothyroxine 25 MCG tablet  Commonly known as:  SYNTHROID, LEVOTHROID  Take 25 mcg by mouth daily before breakfast.     loperamide 2 MG capsule  Commonly known as:  IMODIUM  Take 2 mg by mouth 2 (two) times daily.     losartan 100 MG tablet  Commonly known as:  COZAAR  Take 100 mg by mouth daily.     metoprolol 200 MG 24 hr tablet  Commonly known as:  TOPROL-XL  Take 200 mg by mouth daily.     nitroGLYCERIN 0.4 MG SL tablet  Commonly known as:  NITROSTAT  Place 0.4 mg under the tongue every 5 (five) minutes as needed for chest pain.     ONE-A-DAY MENS PO  Take 1 tablet by mouth daily.     OVER THE COUNTER MEDICATION  Take 2-3 sprays by mouth daily as needed ("essential oils" spray).     pentoxifylline 400 MG CR tablet  Commonly known as:  TRENTAL  Take 400 mg by mouth 3 (three) times daily with meals.     rosuvastatin 40 MG tablet  Commonly known as:  CRESTOR  Take 40 mg by mouth daily.     sertraline 50 MG tablet  Commonly known as:  ZOLOFT  Take 50 mg by mouth daily.       No Known Allergies Follow-up Information   Follow up with [name], MD. Schedule an appointment as soon as possible for a visit in 2 months.   Specialties:  Neurology, Radiology   Contact information:   [address] [place]  [address] [phone]       Schedule an appointment as soon as possible for a visit with Alexander Poll, MD. (f/u 1-2 weeks)    Specialty:  Family Medicine   Contact information:   [address] [place] [address] [phone]        The results of significant diagnostics from this hospitalization (including imaging, microbiology, ancillary and laboratory) are listed below for reference.    Significant Diagnostic Studies: Ct Head Wo Contrast  03/14/2013   CLINICAL DATA:  Left-sided arm pain weakness  EXAM: CT HEAD WITHOUT CONTRAST  TECHNIQUE: Contiguous axial images were obtained from the base of the skull through the vertex without intravenous contrast.  COMPARISON:  None.  FINDINGS: No acute intracranial hemorrhage. No focal mass lesion. No CT evidence of acute infarction. No midline shift or mass effect. No hydrocephalus. Basilar cisterns are patent.  There is a generalized cortical  atrophy and proportional ventricular dilatation. There is mild periventricular subcortical white matter hypodensities. Paranasal sinuses and mastoid air cells are clear. Marland Kitchen  IMPRESSION: 1. No acute intracranial findings. 2. Atrophy and microvascular disease.   Electronically Signed   By: Suzy Bouchard M.D.   On: 03/14/2013 21:38   Mri Brain Without Contrast  03/15/2013   CLINICAL DATA:  Stroke.  Left-sided numbness.  EXAM: MRI HEAD WITHOUT CONTRAST  MRA HEAD WITHOUT CONTRAST  TECHNIQUE: Multiplanar, multiecho pulse sequences of the brain and surrounding structures were obtained without intravenous contrast. Angiographic images of the head were obtained using MRA technique without contrast.  COMPARISON:  CT head 03/14/2013  FINDINGS: MRI HEAD FINDINGS  Multiple areas of acute infarct are present. Small areas of restricted diffusion in the right cerebellum right medulla, superior cerebellar vermis, right temporal lobe, right frontal and parietal lobe, right occipital lobe, right splenium of the  corpus callosum. Small areas are restricted diffusion in the left frontal and parietal lobe. These are most consistent with multiple small emboli.  Moderate atrophy. Chronic microvascular ischemic change in the white matter. Chronic right occipital infarct. Chronic ischemia in the cerebellum bilaterally and in the right lower pons and medulla. Small chronic infarcts in the thalamus bilaterally. Negative for hemorrhage or mass lesion.  MRA HEAD FINDINGS  Right vertebral artery is patent without stenosis. Left vertebral artery ends in PICA and does not contribute to the basilar. The basilar is patent. Superior cerebellar and posterior cerebral arteries are patent. Fetal origin of the left posterior cerebral artery with hypoplastic left P1 segment.  Internal carotid artery is patent bilaterally without stenosis. Anterior and middle cerebral arteries are widely patent.  Negative for cerebral aneurysm.  IMPRESSION: Numerous small areas of acute infarct as described above consistent with multiple small pulmonary emboli.  Atrophy and chronic ischemic changes.  Negative intracranial MRA   Electronically Signed   By: Franchot Gallo M.D.   On: 03/15/2013 13:42   Mr Jodene Nam Head/brain Wo Cm  03/15/2013   CLINICAL DATA:  Stroke.  Left-sided numbness.  EXAM: MRI HEAD WITHOUT CONTRAST  MRA HEAD WITHOUT CONTRAST  TECHNIQUE: Multiplanar, multiecho pulse sequences of the brain and surrounding structures were obtained without intravenous contrast. Angiographic images of the head were obtained using MRA technique without contrast.  COMPARISON:  CT head 03/14/2013  FINDINGS: MRI HEAD FINDINGS  Multiple areas of acute infarct are present. Small areas of restricted diffusion in the right cerebellum right medulla, superior cerebellar vermis, right temporal lobe, right frontal and parietal lobe, right occipital lobe, right splenium of the corpus callosum. Small areas are restricted diffusion in the left frontal and parietal lobe. These are  most consistent with multiple small emboli.  Moderate atrophy. Chronic microvascular ischemic change in the white matter. Chronic right occipital infarct. Chronic ischemia in the cerebellum bilaterally and in the right lower pons and medulla. Small chronic infarcts in the thalamus bilaterally. Negative for hemorrhage or mass lesion.  MRA HEAD FINDINGS  Right vertebral artery is patent without stenosis. Left vertebral artery ends in PICA and does not contribute to the basilar. The basilar is patent. Superior cerebellar and posterior cerebral arteries are patent. Fetal origin of the left posterior cerebral artery with hypoplastic left P1 segment.  Internal carotid artery is patent bilaterally without stenosis. Anterior and middle cerebral arteries are widely patent.  Negative for cerebral aneurysm.  IMPRESSION: Numerous small areas of acute infarct as described above consistent with multiple small pulmonary emboli.  Atrophy and chronic ischemic  changes.  Negative intracranial MRA   Electronically Signed   By: Franchot Gallo M.D.   On: 03/15/2013 13:42    Microbiology: No results found for this or any previous visit (from the past 240 hour(s)).   Labs: Basic Metabolic Panel:  Recent Labs Lab 03/14/13 2016 03/14/13 2022 03/15/13 0335 03/16/13 0510 03/17/13 0644 03/18/13 0415  NA 136* 140 139 141 140 142  K 3.5* 3.4* 3.5* 4.1 3.8 3.8  CL 99 102 102 106 105 106  CO2 24  --  23 22 23 22   GLUCOSE 120* 115* 101* 88 99 80  BUN 16 15 15 18 19 15   CREATININE 0.86 0.90 0.76 0.82 0.78 0.69  CALCIUM 8.8  --  8.7 8.6 8.5 8.8   Liver Function Tests:  Recent Labs Lab 03/14/13 2016 03/15/13 0335  AST 19 18  ALT 10 10  ALKPHOS 73 68  BILITOT 0.3 0.4  PROT 6.4 6.2  ALBUMIN 3.2* 3.2*   No results found for this basename: LIPASE, AMYLASE,  in the last 168 hours No results found for this basename: AMMONIA,  in the last 168 hours CBC:  Recent Labs Lab 03/14/13 2016 03/14/13 2022 03/15/13 0335   WBC 6.4  --  8.8  NEUTROABS 3.9  --  6.2  HGB 12.8* 14.3 12.6*  HCT 38.2* 42.0 37.3*  MCV 87.0  --  86.5  PLT 267  --  270   Cardiac Enzymes:  Recent Labs Lab 03/14/13 2016  TROPONINI <0.30   BNP: BNP (last 3 results) No results found for this basename: PROBNP,  in the last 8760 hours CBG:  Recent Labs Lab 03/17/13 1133 03/17/13 1703 03/17/13 2111 03/18/13 0645 03/18/13 1127  GLUCAP 102* 107* 120* 101* 99       Signed:  THOMPSON,DANIEL MD [facility] 03/18/2013, 2:36 PM

## 2013-03-18 NOTE — H&P (View-Only) (Signed)
[facility] PROGRESS NOTE  Alexander Johnston FAO:[id number] DOB: 1924-06-27 DOA: 03/14/2013 PCP: [name], MD  Assessment/Plan: #1 acute CVA The MRI multiple areas of acute infarct consistent with emboli. Patient was on aspirin prior to admission and currently on full dose aspirin for secondary stroke prevention. Fasting lipid panel with LDL of 86 and patient and on a statin prior to admission. CT of the head was negative. 2-D echo with no source of emboli. Carotid Dopplers preliminary readings with no significant ICA stenosis. Patient refused TEE today. Patient however is agreeable to TEE to be done tomorrow. Neurology is following and appreciate you and per recommendations.  #2 hyperlipidemia LDL of 86. Continue statin.  #3 hypertension Stable. Continue Norvasc and Cardizem and Cozaar.  #4 hypothyroidism Continue Synthroid.  #5 coronary artery disease Stable. Continue Norvasc, Cardizem, Cozaar, aspirin, Crestor.  #6 prophylaxis Lovenox for DVT prophylaxis.  Code Status: Full Family Communication: Updated patient, daughter and son-in-law. Disposition Plan: CIR versus SNF when medically stable   Consultants:  Neurology: Dr. Doy Mince 03/15/2013  Procedures:  CT at 03/14/2013  MRI MRA head 03/15/2013  2-D echo 03/15/2013  Carotid Dopplers 03/15/2013  Antibiotics:  None  HPI/Subjective: Patient asking when he can go home. Patient refused TEE today.  Objective: Filed Vitals:   03/17/13 0959  BP: 136/77  Pulse: 66  Temp: 98 F (36.7 C)  Resp: 20    Intake/Output Summary (Last 24 hours) at 03/17/13 1540 Last data filed at 03/17/13 1001  Gross per 24 hour  Intake      0 ml  Output    800 ml  Net   -800 ml   Filed Weights   03/15/13 0510  Weight: 88.27 kg (194 lb 9.6 oz)    Exam:   General:  NAD  Cardiovascular: RRR  Respiratory: CTAB  Abdomen: Soft, nontender, nondistended, positive bowel sounds  Musculoskeletal: No clubbing cyanosis  or edema  Data Reviewed: Basic Metabolic Panel:  Recent Labs Lab 03/14/13 2016 03/14/13 2022 03/15/13 0335 03/16/13 0510 03/17/13 0644  NA 136* 140 139 141 140  K 3.5* 3.4* 3.5* 4.1 3.8  CL 99 102 102 106 105  CO2 24  --  23 22 23   GLUCOSE 120* 115* 101* 88 99  BUN 16 15 15 18 19   CREATININE 0.86 0.90 0.76 0.82 0.78  CALCIUM 8.8  --  8.7 8.6 8.5   Liver Function Tests:  Recent Labs Lab 03/14/13 2016 03/15/13 0335  AST 19 18  ALT 10 10  ALKPHOS 73 68  BILITOT 0.3 0.4  PROT 6.4 6.2  ALBUMIN 3.2* 3.2*   No results found for this basename: LIPASE, AMYLASE,  in the last 168 hours No results found for this basename: AMMONIA,  in the last 168 hours CBC:  Recent Labs Lab 03/14/13 2016 03/14/13 2022 03/15/13 0335  WBC 6.4  --  8.8  NEUTROABS 3.9  --  6.2  HGB 12.8* 14.3 12.6*  HCT 38.2* 42.0 37.3*  MCV 87.0  --  86.5  PLT 267  --  270   Cardiac Enzymes:  Recent Labs Lab 03/14/13 2016  TROPONINI <0.30   BNP (last 3 results) No results found for this basename: PROBNP,  in the last 8760 hours CBG:  Recent Labs Lab 03/16/13 1107 03/16/13 1644 03/16/13 2058 03/17/13 0618 03/17/13 1133  GLUCAP 104* 128* 121* 92 102*    No results found for this or any previous visit (from the past 240 hour(s)).   Studies: No results  found.  Scheduled Meds: . amLODipine  10 mg Oral Daily  . aspirin  325 mg Oral Daily  . diltiazem  240 mg Oral Daily  . enoxaparin (LOVENOX) injection  40 mg Subcutaneous Q24H  . levothyroxine  25 mcg Oral QAC breakfast  . losartan  100 mg Oral Daily  . rosuvastatin  20 mg Oral Daily  . sertraline  25 mg Oral Daily   Continuous Infusions:   Principal Problem:   CVA (cerebral infarction) Active Problems:   HTN (hypertension)   Other and unspecified hyperlipidemia   Rectal cancer   Diarrhea    Time spent: 35 minutes    [facility] MD [facility] Pager [phone]. If 7PM-7AM, please contact night-coverage at  [email or web address], password [facility] 03/17/2013, 3:40 PM  LOS: 3 days

## 2013-03-18 NOTE — H&P (View-Only) (Signed)
Physical Medicine and Rehabilitation Admission H&P    Chief Complaint  Patient presents with  . Left sided numbness, speech difficulty, confusion.    HPI: Alexander Johnston is a 80 y.o. male with history of CAD, IBS, CVA, who was admitted on 03/15/13 with left sided numbness, fall and difficulty speaking. Patient reported to have similar symptoms few days before that resolved on its own. MRI and MRA brain multiple small areas of acute infarct affecting right cerebellum right medulla, superior  cerebellar vermis, right temporal lobe, right frontal and parietal lobe, right occipital lobe, right splenium of the corpus callosum as well as left frontal and parietal lobe. 2D echo with EF 96-75%, grade 1 diastolic dysfunction and no wall abnormality. Carotid dopplers without significant stenosis. Neurology consulted and recommended changing ASA to 325 mg/daily as well as TEE for work up of embolic source.   Patient agreeable to have TEE done today and negative for LAA thrombus, PFO/ASD and EF 55%. Grade III plaque descending thoracic aorta and arch.  Patient with resultant mild dysmetria LUE, mild left hemiparesis with decreased sensation LUE, as well as ataxic gait with multiple episodes of LOB with ambulation. Noted to have poor insight, lack of awareness into current deficits as well as tendency to run into objects on left. Rehab team recommending CIR and patient admitted today.  CIR.    Review of Systems  Eyes: Negative for blurred vision and double vision.  Respiratory: Negative for cough and shortness of breath.   Cardiovascular: Negative for chest pain and leg swelling.  Gastrointestinal: Positive for constipation. Negative for heartburn.  Genitourinary: Positive for frequency.  Musculoskeletal: Positive for falls. Negative for back pain and myalgias.  Neurological: Positive for sensory change (LLE). Negative for headaches.   Past Medical History  Diagnosis Date  . CAD (coronary artery  disease)   . Hypertension   . Stroke   . GERD (gastroesophageal reflux disease)   . Diarrhea     intermittent past 4 years  . Colon cancer 10/17/12    adenocarcinoma  . IBS (irritable bowel syndrome)   . Hypothyroidism   . Abrasion of great toe of right foot   . Abrasion of second toe, right   . Abrasion of right lower leg    Past Surgical History  Procedure Laterality Date  . Knee surgery      bilateral  . Colonoscopy w/ biopsies  10/17/12    rectosigmoid=adenocarcinoma  . Eus N/A 11/07/2012    Procedure: LOWER ENDOSCOPIC ULTRASOUND (EUS);  Surgeon: Milus Banister, MD;  Location: [facility];  Service: Endoscopy;  Laterality: N/A;   Family History  Problem Relation Age of Onset  . Heart disease Mother     "all her family"  . Diabetes Brother   . Hypertension Brother   . Cancer Daughter     choriocarricinoma   Social History:  Lives alone.  Per reports that he has never smoked. Has home MD visits.  He has never used smokeless tobacco. He reports that he does not drink alcohol or use illicit drugs.   Allergies: No Known Allergies  Medications Prior to Admission  Medication Sig Dispense Refill  . amLODipine (NORVASC) 10 MG tablet Take 10 mg by mouth daily.      Marland Kitchen aspirin 81 MG chewable tablet Chew 162 mg by mouth once.      . diltiazem (CARDIZEM CD) 240 MG 24 hr capsule Take 240 mg by mouth daily.      Marland Kitchen  hydrochlorothiazide (HYDRODIURIL) 25 MG tablet Take 12.5 mg by mouth daily.      Marland Kitchen levothyroxine (SYNTHROID, LEVOTHROID) 25 MCG tablet Take 25 mcg by mouth daily before breakfast.      . loperamide (IMODIUM) 2 MG capsule Take 2 mg by mouth 2 (two) times daily.      Marland Kitchen losartan (COZAAR) 100 MG tablet Take 100 mg by mouth daily.      . Multiple Vitamin (ONE-A-DAY MENS PO) Take 1 tablet by mouth daily.      . nitroGLYCERIN (NITROSTAT) 0.4 MG SL tablet Place 0.4 mg under the tongue every 5 (five) minutes as needed for chest pain.      Marland Kitchen OVER THE COUNTER MEDICATION Take 2-3  sprays by mouth daily as needed ("essential oils" spray).      . pentoxifylline (TRENTAL) 400 MG CR tablet Take 400 mg by mouth 3 (three) times daily with meals.      . sertraline (ZOLOFT) 50 MG tablet Take 50 mg by mouth daily.      . metoprolol (TOPROL-XL) 200 MG 24 hr tablet Take 200 mg by mouth daily.      . rosuvastatin (CRESTOR) 40 MG tablet Take 40 mg by mouth daily.        Home: Home Living Family/patient expects to be discharged to:: Private residence Living Arrangements: Alone Available Help at Discharge: Family;Available PRN/intermittently Type of Home: Apartment Home Access: Level entry Home Layout: One level Home Equipment: Grab bars - toilet;Cane - single point Additional Comments: patient with poor recognition of deficits, continues to state that he can walk just fine and that he has fallen before and is real careful.   Functional History: Prior Function Comments: cane  Functional Status:  Mobility:     Ambulation/Gait Ambulation Distance (Feet): 180 Feet Gait velocity: decreased General Gait Details: Patient significantly unsteady with ambulation regardless of assistive device use. Patient constantly staggering to the left with ataxic like gait. Had 3 incidents of complete assist to prevent falling over and 1 incident where patietn fell into the wall while attempting to ambulate. Patient continuously running into objects (mainly on left but did happen occassionaly on the right as well). Unsafe to ambulate without physical assist.    ADL: ADL Grooming: Wash/dry hands;Teeth care;Minimal assistance (balance) Where Assessed - Grooming: Supported standing Lower Body Bathing: Min guard;Minimal assistance (balance; washed peri area) Where Assessed - Lower Body Bathing: Supported standing Upper Body Dressing: Set up Where Assessed - Upper Body Dressing: Supported sitting Lower Body Dressing: Minimal assistance Where Assessed - Lower Body Dressing: Supported sit to  Lobbyist: Minimal Print production planner Method: Sit to Loss adjuster, chartered: Comfort height toilet;Grab bars;Regular height toilet Tub/Shower Transfer Method: Not assessed Equipment Used: Gait belt;Rolling walker Transfers/Ambulation Related to ADLs: Mod A for ambulation-pt running into things with walker. Min guard/Min A for transfers. ADL Comments: Recommended sitting for bathing and dressing.   Cognition: Cognition Overall Cognitive Status: Impaired/Different from baseline Orientation Level: Oriented X4 Cognition Arousal/Alertness: Awake/alert Behavior During Therapy: WFL for tasks assessed/performed Overall Cognitive Status: Impaired/Different from baseline Area of Impairment: Safety/judgement;Problem solving Safety/Judgement: Decreased awareness of safety Problem Solving: Slow processing;Requires verbal cues;Requires tactile cues;Difficulty sequencing   Blood pressure 148/75, pulse 66, temperature 98.1 F (36.7 C), temperature source Oral, resp. rate 20, height 6' 2"  (1.88 m), weight 88.27 kg (194 lb 9.6 oz), SpO2 100.00%.   Physical Exam Constitutional: He appears fairly well-developed and well-nourished.  HENT: oral mucosa pink and moist Head: Normocephalic  and atraumatic.  Eyes: Conjunctivae and EOM are normal. Pupils are equal, round, and reactive to light.  Neck: No JVD present. No tracheal deviation present. No thyromegaly present.  Cardiovascular: Normal rate. No appreciable murmur.  Respiratory: No respiratory distress. No wheezes rales or rhonchi GI: He exhibits no distension. There is no tenderness. Bowel sounds are positive.  Lymphadenopathy:  He has no cervical adenopathy.  Neurological:  Pt sedated after procedure. Moves all 4's fairly equally when awakened. Decreased fine motor on the left but inconsistent. Left inattention. No left to right sensory differences. No resting tone. Coordination fair to good. Decreased insight and  awareness. No CN abnl.  Skin:  Breakdown over right 1st two toes and along medial ankle still present. Appears to be partly from scratching.  Psychiatric:  sedated   Results for orders placed during the hospital encounter of 03/14/13 (from the past 48 hour(s))  GLUCOSE, CAPILLARY     Status: Abnormal   Collection Time    03/16/13  4:44 PM      Result Value Range   Glucose-Capillary 128 (*) 70 - 99 mg/dL  GLUCOSE, CAPILLARY     Status: Abnormal   Collection Time    03/16/13  8:58 PM      Result Value Range   Glucose-Capillary 121 (*) 70 - 99 mg/dL   Comment 1 Documented in Chart     Comment 2 Notify RN    GLUCOSE, CAPILLARY     Status: None   Collection Time    03/17/13  6:18 AM      Result Value Range   Glucose-Capillary 92  70 - 99 mg/dL   Comment 1 Documented in Chart     Comment 2 Notify RN    BASIC METABOLIC PANEL     Status: Abnormal   Collection Time    03/17/13  6:44 AM      Result Value Range   Sodium 140  137 - 147 mEq/L   Potassium 3.8  3.7 - 5.3 mEq/L   Chloride 105  96 - 112 mEq/L   CO2 23  19 - 32 mEq/L   Glucose, Bld 99  70 - 99 mg/dL   BUN 19  6 - 23 mg/dL   Creatinine, Ser 0.78  0.50 - 1.35 mg/dL   Calcium 8.5  8.4 - 10.5 mg/dL   GFR calc non Af Amer 79 (*) >90 mL/min   GFR calc Af Amer >90  >90 mL/min   Comment: (NOTE)     The eGFR has been calculated using the CKD EPI equation.     This calculation has not been validated in all clinical situations.     eGFR's persistently <90 mL/min signify possible Chronic Kidney     Disease.  GLUCOSE, CAPILLARY     Status: Abnormal   Collection Time    03/17/13 11:33 AM      Result Value Range   Glucose-Capillary 102 (*) 70 - 99 mg/dL   Comment 1 Notify RN     Comment 2 Documented in Chart    GLUCOSE, CAPILLARY     Status: Abnormal   Collection Time    03/17/13  5:03 PM      Result Value Range   Glucose-Capillary 107 (*) 70 - 99 mg/dL   Comment 1 Notify RN     Comment 2 Documented in Chart    GLUCOSE,  CAPILLARY     Status: Abnormal   Collection Time    03/17/13  9:11 PM  Result Value Range   Glucose-Capillary 120 (*) 70 - 99 mg/dL  BASIC METABOLIC PANEL     Status: Abnormal   Collection Time    03/18/13  4:15 AM      Result Value Range   Sodium 142  137 - 147 mEq/L   Potassium 3.8  3.7 - 5.3 mEq/L   Chloride 106  96 - 112 mEq/L   CO2 22  19 - 32 mEq/L   Glucose, Bld 80  70 - 99 mg/dL   BUN 15  6 - 23 mg/dL   Creatinine, Ser 0.69  0.50 - 1.35 mg/dL   Calcium 8.8  8.4 - 10.5 mg/dL   GFR calc non Af Amer 84 (*) >90 mL/min   GFR calc Af Amer >90  >90 mL/min   Comment: (NOTE)     The eGFR has been calculated using the CKD EPI equation.     This calculation has not been validated in all clinical situations.     eGFR's persistently <90 mL/min signify possible Chronic Kidney     Disease.  GLUCOSE, CAPILLARY     Status: Abnormal   Collection Time    03/18/13  6:45 AM      Result Value Range   Glucose-Capillary 101 (*) 70 - 99 mg/dL  GLUCOSE, CAPILLARY     Status: None   Collection Time    03/18/13 11:27 AM      Result Value Range   Glucose-Capillary 99  70 - 99 mg/dL   Comment 1 Notify RN     Comment 2 Documented in Chart     No results found.  Post Admission Physician Evaluation: 1. Functional deficits secondary  to multiple distribution right brain infarcts, ? Embolic source. 2. Patient is admitted to receive collaborative, interdisciplinary care between the physiatrist, rehab nursing staff, and therapy team. 3. Patient's level of medical complexity and substantial therapy needs in context of that medical necessity cannot be provided at a lesser intensity of care such as a SNF. 4. Patient has experienced substantial functional loss from his/her baseline which was documented above under the "Functional History" and "Functional Status" headings.  Judging by the patient's diagnosis, physical exam, and functional history, the patient has potential for functional progress which  will result in measurable gains while on inpatient rehab.  These gains will be of substantial and practical use upon discharge  in facilitating mobility and self-care at the household level. 5. Physiatrist will provide 24 hour management of medical needs as well as oversight of the therapy plan/treatment and provide guidance as appropriate regarding the interaction of the two. 6. 24 hour rehab nursing will assist with bladder management, bowel management, safety, skin/wound care, disease management, medication administration, pain management and patient education  and help integrate therapy concepts, techniques,education, etc. 7. PT will assess and treat for/with: Lower extremity strength, range of motion, stamina, balance, functional mobility, safety, adaptive techniques and equipment, NMR, patient edcuation.   Goals are: mod I to supervision. 8. OT will assess and treat for/with: ADL's, functional mobility, safety, upper extremity strength, adaptive techniques and equipment, NMR, patient and family education.   Goals are: mod I to supervision. 9. SLP will assess and treat for/with: cognitive awareness, communication, safety awareness.  Goals are: supervision  10. Case Management and Social Worker will assess and treat for psychological issues and discharge planning. 11. Team conference will be held weekly to assess progress toward goals and to determine barriers to discharge. 12. Patient will receive at  least 3 hours of therapy per day at least 5 days per week. 13. ELOS: 7-10 days       14. Prognosis:  excellent   Medical Problem List and Plan: 1. DVT Prophylaxis/Anticoagulation: Pharmaceutical: Lovenox 2. Pain Management: prn tylenol. 3. Mood:  Improvement in anxiety levels today.  Provide ego support. LCSW to follow for evaluation and support.  4. Neuropsych: This patient is not capable of making decisions on his own behalf. 5. Multiple abrasions: right lower leg, left shin and well as Right  1st and 2nd toes.  6. Colon cancer: History of intermittent diarrhea.  7. HTN: Will monitor with bid checks. Continue Cardizem, Norvasc and Losartan.  8. Dyslipidemia: continue Crestor.    Meredith Staggers, MD, [facility]   03/18/2013

## 2013-03-18 NOTE — Progress Notes (Signed)
Stroke Team Progress Note  HISTORY Alexander Johnston is an 80 y.o. male who lives alone. Two days prior to admission he had an episode of left sided numbness. Symptoms resolved on their own and he did not mention it to his daughter until the next day. He seemed to be doing well that day and was even able to drive. On 03/14/2013 the patient was well and took a nap at 1PM. When he awakened he was lightheaded and off balance. He also again noted numbness in his left arm. His daughter came to the house to check on him and he had burned a rag cooking. She also noted that he had fallen. He was also having difficulty with language which is not usual for him. EMS was called and the patient was brought to [facility] for evaluation.   Date last known well: Date: 03/14/2013  Time last known well: Time: 13:00  tPA Given: No: Outside time window  SUBJECTIVE "I wish they would come on a do the test." "It is almost lunch!!"  OBJECTIVE Most recent Vital Signs: Filed Vitals:   03/17/13 2112 03/18/13 0102 03/18/13 0501 03/18/13 0955  BP: 145/68 164/62 122/66 148/75  Pulse: 63 74 66 66  Temp: 98.4 F (36.9 C) 98.2 F (36.8 C) 98.5 F (36.9 C) 98.1 F (36.7 C)  TempSrc: Oral Oral Oral Oral  Resp: 18 18 18 20   Height:      Weight:      SpO2: 100% 100% 98% 100%   CBG (last 3)   Recent Labs  03/17/13 1703 03/17/13 2111 03/18/13 0645  GLUCAP 107* 120* 101*    IV Fluid Intake:     MEDICATIONS  . amLODipine  10 mg Oral Daily  . aspirin  325 mg Oral Daily  . diltiazem  240 mg Oral Daily  . enoxaparin (LOVENOX) injection  40 mg Subcutaneous Q24H  . levothyroxine  25 mcg Oral QAC breakfast  . losartan  100 mg Oral Daily  . rosuvastatin  20 mg Oral Daily  . sertraline  25 mg Oral Daily   PRN:  acetaminophen, nitroGLYCERIN, triamcinolone 0.1 % cream : eucerin  Diet:  NPO  Activity: Bathroom privileges with assistance DVT Prophylaxis:  Lovenox  CLINICALLY SIGNIFICANT STUDIES Basic Metabolic Panel:    Recent Labs Lab 03/17/13 0644 03/18/13 0415  NA 140 142  K 3.8 3.8  CL 105 106  CO2 23 22  GLUCOSE 99 80  BUN 19 15  CREATININE 0.78 0.69  CALCIUM 8.5 8.8   Liver Function Tests:   Recent Labs Lab 03/14/13 2016 03/15/13 0335  AST 19 18  ALT 10 10  ALKPHOS 73 68  BILITOT 0.3 0.4  PROT 6.4 6.2  ALBUMIN 3.2* 3.2*   CBC:   Recent Labs Lab 03/14/13 2016 03/14/13 2022 03/15/13 0335  WBC 6.4  --  8.8  NEUTROABS 3.9  --  6.2  HGB 12.8* 14.3 12.6*  HCT 38.2* 42.0 37.3*  MCV 87.0  --  86.5  PLT 267  --  270   Coagulation:   Recent Labs Lab 03/14/13 2016  LABPROT 12.9  INR 0.99   Cardiac Enzymes:   Recent Labs Lab 03/14/13 2016  TROPONINI <0.30   Urinalysis:   Recent Labs Lab 03/14/13 2144  COLORURINE YELLOW  LABSPEC 1.012  PHURINE 5.5  GLUCOSEU NEGATIVE  HGBUR NEGATIVE  BILIRUBINUR NEGATIVE  KETONESUR NEGATIVE  PROTEINUR NEGATIVE  UROBILINOGEN 0.2  NITRITE NEGATIVE  LEUKOCYTESUR NEGATIVE   Lipid Panel  Component Value Date/Time   CHOL 140 03/15/2013 0500   TRIG 89 03/15/2013 0500   HDL 36* 03/15/2013 0500   CHOLHDL 3.9 03/15/2013 0500   VLDL 18 03/15/2013 0500   LDLCALC 86 03/15/2013 0500   HgbA1C  Lab Results  Component Value Date   HGBA1C 5.7* 03/15/2013    Urine Drug Screen:     Component Value Date/Time   LABOPIA NONE DETECTED 03/15/2013 0429   COCAINSCRNUR NONE DETECTED 03/15/2013 0429   LABBENZ NONE DETECTED 03/15/2013 0429   AMPHETMU NONE DETECTED 03/15/2013 0429   THCU NONE DETECTED 03/15/2013 0429   LABBARB NONE DETECTED 03/15/2013 0429    Alcohol Level:   Recent Labs Lab 03/15/13 0500  ETH <11    Ct Head Wo Contrast 03/14/2013   1. No acute intracranial findings. 2. Atrophy and microvascular disease.    Mri Brain Without Contrast 03/15/2013    Multiple areas of acute infarct are present. Small areas of restricted diffusion in the right cerebellum right medulla, superior cerebellar vermis, right temporal lobe, right  frontal and parietal lobe, right occipital lobe, right splenium of the corpus callosum. Small areas are restricted diffusion in the left frontal and parietal lobe. These are most consistent with multiple small emboli.  Numerous small areas of acute infarct as described above consistent with multiple small pulmonary emboli.  Atrophy and chronic ischemic changes.    MRA Head/brain Wo Cm 03/15/2013  Negative intracranial MRA     2D Echocardiogram  EF 50-55%. No cardiac source of emboli identified.  Carotid Doppler  No evidence of hemodynamically significant internal carotid artery stenosis. Vertebral artery flow is antegrade.   EKG  sinus rhythm rate 73 beats per minute  Therapy Recommendations CIR  Physical Exam   Neurologic Examination:  Mental Status:  Alert, oriented. Repeats himself frequently. Has some difficulty with word finding. Fluent.. Able to follow 3 step commands without difficulty.  Cranial Nerves:  II: Discs flat bilaterally; Visual fields grossly normal, pupils equal, round, reactive to light and accommodation  III,IV, VI: ptosis not present, extra-ocular motions intact bilaterally  V,VII: smile symmetric, facial light touch sensation normal bilaterally  VIII: hearing decreased bilaterally  IX,X: gag reflex present  XI: bilateral shoulder shrug  XII: midline tongue extension  Motor:  Right : Upper extremity 5/5 Left: Upper extremity 5/5 with pronator drift  Lower extremity 5/5 Lower extremity 5/5                   Tone and bulk:normal tone throughout; no atrophy noted  Sensory: Pinprick and light touch decreased in the left upper extremity  Deep Tendon Reflexes: 2+ in the upper extremities, absent in the lower extremities.  Plantars:  Right: mute Left: mute  Cerebellar:  Mild dysmetria with finger-to-nose and heel-to-shin testing on the left  Gait: Unable to test safely  CV: pulses palpable throughout    ASSESSMENT Mr. Alexander Johnston is a 80 y.o. male presenting  with balance problems, left upper extremity numbness, and speech difficulties. TPA was not administered as the patient was outside the window for treatment. An MRI revealed multiple bilateral areas of acute infarct in anterior and posterior circulation consistent with emboli of an unknown etiology. On aspirin 81 mg daily prior to admission. Now on aspirin 325 mg orally every day for secondary stroke prevention. Patient with resultant speech difficulties, coordination difficulties, mild left hemiparesis, and decreased sensation of the left upper extremity, confusion. Work up underway.  Patient refused TEE yesterday. Now agreeable.  Hyperlipidemia - Crestor prior to admission and now - cholesterol 140 LDL 86  Coronary artery disease  Hypertension  History of colon cancer  Previous stroke  Hospital day # 4  TREATMENT/PLAN  Continue aspirin 325 mg orally every day for secondary stroke prevention. TEE to look for embolic source. Arranged for today at 130p.  If positive for PFO (patent foramen ovale), check bilateral lower extremity venous dopplers to rule out DVT as possible source of stroke.   Monitor heart rhythm while an IP   IP Rehab vs SNF at discharge. Rehab consult in place  No further stroke workup indicated.  Patient has a 10-15% risk of having another stroke over the next year, the highest risk is within 2 weeks of the most recent stroke/TIA (risk of having a stroke following a stroke or TIA is the same).  Ongoing risk factor control by Primary Care Physician  Stroke Service will sign off. Please call should any needs arise.  Follow up with Dr. Leonie Man, [facility], in 2 months.  Burnetta Sabin, MSN, RN, ANVP-BC, ANP-BC, [facility] Pager: [phone] 03/18/2013 11:35 AM  I have personally obtained a history, examined the patient, evaluated imaging results, and formulated the assessment and plan of care. I agree with the above.  Antony Contras, MD

## 2013-03-18 NOTE — Progress Notes (Signed)
UR complete.  Alistar Mcenery RN, MSN 

## 2013-03-19 ENCOUNTER — Inpatient Hospital Stay (HOSPITAL_COMMUNITY): Payer: Medicare Other | Admitting: Speech Pathology

## 2013-03-19 ENCOUNTER — Encounter (HOSPITAL_COMMUNITY): Payer: Self-pay | Admitting: Cardiology

## 2013-03-19 ENCOUNTER — Inpatient Hospital Stay (HOSPITAL_COMMUNITY): Payer: Medicare Other | Admitting: Rehabilitation

## 2013-03-19 ENCOUNTER — Inpatient Hospital Stay (HOSPITAL_COMMUNITY): Payer: Medicare Other | Admitting: Occupational Therapy

## 2013-03-19 DIAGNOSIS — I6330 Cerebral infarction due to thrombosis of unspecified cerebral artery: Secondary | ICD-10-CM

## 2013-03-19 DIAGNOSIS — I69993 Ataxia following unspecified cerebrovascular disease: Secondary | ICD-10-CM

## 2013-03-19 LAB — CBC WITH DIFFERENTIAL/PLATELET
BASOS PCT: 1 % (ref 0–1)
Basophils Absolute: 0.1 10*3/uL (ref 0.0–0.1)
EOS ABS: 0.8 10*3/uL — AB (ref 0.0–0.7)
EOS PCT: 11 % — AB (ref 0–5)
HCT: 37.3 % — ABNORMAL LOW (ref 39.0–52.0)
Hemoglobin: 12.4 g/dL — ABNORMAL LOW (ref 13.0–17.0)
Lymphocytes Relative: 15 % (ref 12–46)
Lymphs Abs: 1 10*3/uL (ref 0.7–4.0)
MCH: 29.5 pg (ref 26.0–34.0)
MCHC: 33.2 g/dL (ref 30.0–36.0)
MCV: 88.6 fL (ref 78.0–100.0)
MONOS PCT: 13 % — AB (ref 3–12)
Monocytes Absolute: 0.9 10*3/uL (ref 0.1–1.0)
Neutro Abs: 4 10*3/uL (ref 1.7–7.7)
Neutrophils Relative %: 60 % (ref 43–77)
Platelets: 270 10*3/uL (ref 150–400)
RBC: 4.21 MIL/uL — ABNORMAL LOW (ref 4.22–5.81)
RDW: 16.4 % — ABNORMAL HIGH (ref 11.5–15.5)
WBC: 6.7 10*3/uL (ref 4.0–10.5)

## 2013-03-19 LAB — COMPREHENSIVE METABOLIC PANEL
ALK PHOS: 64 U/L (ref 39–117)
ALT: 14 U/L (ref 0–53)
AST: 21 U/L (ref 0–37)
Albumin: 2.9 g/dL — ABNORMAL LOW (ref 3.5–5.2)
BUN: 23 mg/dL (ref 6–23)
CALCIUM: 8.8 mg/dL (ref 8.4–10.5)
CO2: 24 mEq/L (ref 19–32)
CREATININE: 0.86 mg/dL (ref 0.50–1.35)
Chloride: 106 mEq/L (ref 96–112)
GFR calc non Af Amer: 76 mL/min — ABNORMAL LOW (ref >90)
GFR, EST AFRICAN AMERICAN: 88 mL/min — AB (ref >90)
GLUCOSE: 97 mg/dL (ref 70–99)
Potassium: 4.4 mEq/L (ref 3.7–5.3)
Sodium: 142 mEq/L (ref 137–147)
Total Bilirubin: 0.2 mg/dL — ABNORMAL LOW (ref 0.3–1.2)
Total Protein: 5.9 g/dL — ABNORMAL LOW (ref 6.0–8.3)

## 2013-03-19 MED ORDER — BACITRACIN 500 UNIT/GM EX OINT
1.0000 "application " | TOPICAL_OINTMENT | Freq: Two times a day (BID) | CUTANEOUS | Status: DC
  Administered 2013-03-19 – 2013-03-20 (×2): 1 via TOPICAL
  Filled 2013-03-19 (×5): qty 0.9

## 2013-03-19 NOTE — Evaluation (Signed)
Speech Language Pathology Assessment and Plan  Patient Details  Name: Alexander Johnston MRN: [id number] Date of Birth: [birth year]  SLP Diagnosis: Cognitive Impairments  Rehab Potential: Good ELOS: 7 days   Today's Date: 03/19/2013 Time: 7673-4193 Time Calculation (min): 70 min  Problem List:  Patient Active Problem List   Diagnosis Date Noted  . CVA (cerebral infarction) 03/15/2013  . Diarrhea   . Rectal cancer 10/21/2012  . Colon cancer 10/17/2012  . HTN (hypertension) 10/15/2012  . Other and unspecified hyperlipidemia 10/15/2012  . Heart disease, unspecified 10/15/2012   Past Medical History:  Past Medical History  Diagnosis Date  . CAD (coronary artery disease)   . Hypertension   . Stroke   . GERD (gastroesophageal reflux disease)   . Diarrhea     intermittent past 4 years  . Colon cancer 10/17/12    adenocarcinoma  . IBS (irritable bowel syndrome)   . Hypothyroidism   . Abrasion of great toe of right foot   . Abrasion of second toe, right   . Abrasion of right lower leg    Past Surgical History:  Past Surgical History  Procedure Laterality Date  . Knee surgery      bilateral  . Colonoscopy w/ biopsies  10/17/12    rectosigmoid=adenocarcinoma  . Eus N/A 11/07/2012    Procedure: LOWER ENDOSCOPIC ULTRASOUND (EUS);  Surgeon: Milus Banister, MD;  Location: [facility];  Service: Endoscopy;  Laterality: N/A;  . Tee without cardioversion N/A 03/18/2013    Procedure: TRANSESOPHAGEAL ECHOCARDIOGRAM (TEE);  Surgeon: Larey Dresser, MD;  Location: [facility] ENDOSCOPY;  Service: Cardiovascular;  Laterality: N/A;    Assessment / Plan / Recommendation Clinical Impression Alexander Johnston is a 80 y.o. male with history of CAD, IBS, CVA, who was admitted on 03/15/13 with left sided numbness, fall and difficulty speaking. Patient reported to have similar symptoms few days before that resolved on its own. MRI and MRA brain multiple small areas of acute infarct affecting right cerebellum right  medulla, superior cerebellar vermis, right temporal lobe, right frontal and parietal lobe, right occipital lobe, right splenium of the corpus callosum as well as left frontal and parietal lobe. 2D echo with EF 79-02%, grade 1 diastolic dysfunction and no wall abnormality. Carotid dopplers without significant stenosis. Neurology consulted and recommended changing ASA to 325 mg/daily as well as TEE for work up of embolic source. Patient admitted to [facility] 03/18/2013. Patient was given the Cognistat and scored in the average range for orientation, attention, comprehension, naming, calculation, and reasoning. Patient's scores indicate a mild impairment in repetition and memory. Impairments in repetition possibly do to attention and hearing deficits. Planning and spatial awareness deficits were noted when patient was drawing a clock and put the numbers on the outside. When given a higher-level cognitive task such as balancing a checkbook, patient required extra time and Min verbal cues to complete the task due to impairments in organizing and self-correcting. Patient is not aware of deficits and made excuses during the checkbook task. During an OME, patient had reduced intraoral pressure and asymmetrical palatal elevation on the right side. OM impairments have no effect on patient's speech or swallowing. Patient's cognitive deficits indicate that he could benefit from skilled SLP intervention to address complex problem solving to maximize independence and reduce burden of care.      SLP Assessment  Patient will need skilled Speech Lanaguage Pathology Services during CIR admission    Recommendations  Diet Recommendations: Regular;Thin liquid Liquid Administration via:  Straw;Cup Medication Administration: Whole meds with liquid Supervision: Patient able to self feed Postural Changes and/or Swallow Maneuvers: Seated upright 90 degrees Equipment Recommended: None recommended by SLP    SLP Frequency 5 out of 7 days    SLP Treatment/Interventions Cognitive remediation/compensation;Cueing hierarchy;Functional tasks;Internal/external aids;Medication managment;Patient/family education;Therapeutic Activities    Pain Pain Assessment Pain Assessment: No/denies pain Prior Functioning Cognitive/Linguistic Baseline: Within functional limits Type of Home: Apartment Available Help at Discharge: Family;Available PRN/intermittently Vocation: Retired  Industrial/product designer Term Goals: Week 1: SLP Short Term Goal 1 (Week 1): Patient will complete complex problem solving tasks with supervision verbal cues. SLP Short Term Goal 2 (Week 1): Patient will verbilize two modifications he will make to increase safety upon discharge.   See FIM for current functional status Refer to Care Plan for Long Term Goals  Recommendations for other services: None  Discharge Criteria: Patient will be discharged from SLP if patient refuses treatment 3 consecutive times without medical reason, if treatment goals not met, if there is a change in medical status, if patient makes no progress towards goals or if patient is discharged from hospital.  The above assessment, treatment plan, treatment alternatives and goals were discussed and mutually agreed upon: by patient  Johnella Crumm 03/19/2013, 1:22 PM

## 2013-03-19 NOTE — Patient Care Conference (Signed)
Inpatient RehabilitationTeam Conference and Plan of Care Update Date: 03/19/2013   Time: 11;30 AM    Patient Name: Alexander Johnston      Medical Record Number: [id number]  Date of Birth: 08/26/1924 Sex: Male         Room/Bed: 4W24C/4W24C-01 Payor Info: Payor: MEDICARE / Plan: MEDICARE PART A AND B / Product Type: *No Product type* /    Admitting Diagnosis: RT CVA  Admit Date/Time:  03/18/2013  6:21 PM Admission Comments: No comment available   Primary Diagnosis:  <principal problem not specified> Principal Problem: <principal problem not specified>  Patient Active Problem List   Diagnosis Date Noted  . CVA (cerebral infarction) 03/15/2013  . Diarrhea   . Rectal cancer 10/21/2012  . Colon cancer 10/17/2012  . HTN (hypertension) 10/15/2012  . Other and unspecified hyperlipidemia 10/15/2012  . Heart disease, unspecified 10/15/2012    Expected Discharge Date: Expected Discharge Date: 03/25/13  Team Members Present: Physician leading conference: Dr. Alysia Penna Social Worker Present: Alfonse Alpers, LCSW;Becky Elouise Divelbiss, LCSW Nurse Present: Other (comment) Joelene Millin Cook-RN) PT Present: Cameron Sprang, Cecille Rubin, PT OT Present: Clyda Greener, Lorelee Cover, OT SLP Present: Gunnar Fusi, SLP PPS Coordinator present : Daiva Nakayama, RN, CRRN     Current Status/Progress Goal Weekly Team Focus  Medical   poor awareness of deficits, decreased balance requiring Min A  Sup/Mod I  Improve awareness of deficits   Bowel/Bladder   Continent of bowel and bladder; LBM 03/17/13  Continent of bowel and bladder.  Continue to offer toileting and urinal PRN   Swallow/Nutrition/ Hydration     WFL-Regular diet        ADL's   Pt currenlty performs transfer with min assist stand pivot to the 3:1, and also needs min assist for standing at the sink with grooming tasks and for bathing and dressing.  Demonstrates decreased awareness of his balance deficits as well..  supervision level for all  selfcare tasks  selfcare re-traiining, balance retraining, cognitive awareness and safety education   Mobility     eval pending        Communication     WFL        Safety/Cognition/ Behavioral Observations  Min assist complex problem solving   Supervision   increase pocessing speed, organization and sequencing   Pain   Pain related to blister/skin tear on right lower leg, managed with tylenol  </=3  Offer pain medicine PRN, keep wound covered and protected   Skin   Skin tear and serous filled blister to right shin  No new skin breakdown  Keep wound clean and protected      *See Care Plan and progress notes for long and short-term goals.  Barriers to Discharge: wife in memory unit    Possible Resolutions to Barriers:  Needs int sup    Discharge Planning/Teaching Needs:   Assisting Living facility upon discharge if can get pt to agree.  Daughter is on-board, but pt is not at this time      Team Discussion:  Pt has balance and lacks awareness, recommendation is 24 hr supervision level upon discharge.  Cont B & B, some urgency issues  Revisions to Treatment Plan:  New Eval   Continued Need for Acute Rehabilitation Level of Care: The patient requires daily medical management by a physician with specialized training in physical medicine and rehabilitation for the following conditions: Daily direction of a multidisciplinary physical rehabilitation program to ensure safe treatment while eliciting the highest outcome  that is of practical value to the patient.: Yes Daily medical management of patient stability for increased activity during participation in an intensive rehabilitation regime.: Yes Daily analysis of laboratory values and/or radiology reports with any subsequent need for medication adjustment of medical intervention for : Neurological problems  Rickelle Sylvestre, Gardiner Rhyme 03/20/2013, 8:55 AM

## 2013-03-19 NOTE — Evaluation (Signed)
The assessment and plan has been reviewed and SLP is in agreement. Thiago Ragsdale, M.A., CCC-SLP [phone]  

## 2013-03-19 NOTE — Evaluation (Signed)
Occupational Therapy Assessment and Plan  Patient Details  Name: Alexander Johnston MRN: [id number] Date of Birth: May 24, 1924  OT Diagnosis: abnormal posture, cognitive deficits and muscle weakness (generalized) Rehab Potential: Rehab Potential: Good ELOS: 7-10 days   Today's Date: 03/19/2013 Time: 1000-1115 Time Calculation (min): 75 min  Problem List:  Patient Active Problem List   Diagnosis Date Noted  . CVA (cerebral infarction) 03/15/2013  . Diarrhea   . Rectal cancer 10/21/2012  . Colon cancer 10/17/2012  . HTN (hypertension) 10/15/2012  . Other and unspecified hyperlipidemia 10/15/2012  . Heart disease, unspecified 10/15/2012    Past Medical History:  Past Medical History  Diagnosis Date  . CAD (coronary artery disease)   . Hypertension   . Stroke   . GERD (gastroesophageal reflux disease)   . Diarrhea     intermittent past 4 years  . Colon cancer 10/17/12    adenocarcinoma  . IBS (irritable bowel syndrome)   . Hypothyroidism   . Abrasion of great toe of right foot   . Abrasion of second toe, right   . Abrasion of right lower leg    Past Surgical History:  Past Surgical History  Procedure Laterality Date  . Knee surgery      bilateral  . Colonoscopy w/ biopsies  10/17/12    rectosigmoid=adenocarcinoma  . Eus N/A 11/07/2012    Procedure: LOWER ENDOSCOPIC ULTRASOUND (EUS);  Surgeon: Milus Banister, MD;  Location: [facility];  Service: Endoscopy;  Laterality: N/A;  . Tee without cardioversion N/A 03/18/2013    Procedure: TRANSESOPHAGEAL ECHOCARDIOGRAM (TEE);  Surgeon: Larey [facility]er, MD;  Location: [facility];  Service: Cardiovascular;  Laterality: N/A;    Assessment & Plan Clinical Impression: Patient is a 80 y.o. year old male with recent admission to the hospital on  with  03/15/13 with left sided numbness, fall and difficulty speaking. Patient reported to have similar symptoms few days before that resolved on its own. MRI and MRA brain multiple small areas of  acute infarct affecting right cerebellum right medulla, superior cerebellar vermis, right temporal lobe, right frontal and parietal lobe, right occipital lobe, right splenium of the corpus callosum as well as left frontal and parietal lobe. Patient transferred to CIR on 03/18/2013 .    Patient currently requires min with basic self-care skills secondary to muscle weakness and decreased attention, decreased awareness and decreased safety awareness.  Prior to hospitalization, patient could complete ADLs with modified independent .  Patient will benefit from skilled intervention to decrease level of assist with basic self-care skills and increase independence with basic self-care skills prior to discharge home with daughter and 24 hour supervision.  Anticipate patient will require 24 hour supervision and follow up home health.  OT - End of Session Activity Tolerance: Tolerates 30+ min activity with multiple rests Endurance Deficit: Yes Endurance Deficit Description: dyspnea 3/4 with mobility in the room duirng ADLs OT Assessment Rehab Potential: Good Barriers to Discharge: Decreased caregiver support OT Patient demonstrates impairments in the following area(s): Balance;Cognition;Endurance;Motor;Safety OT Basic ADL's Functional Problem(s): Grooming;Bathing;Dressing;Toileting OT Advanced ADL's Functional Problem(s): Simple Meal Preparation OT Transfers Functional Problem(s): Toilet;Tub/Shower OT Plan OT Intensity: Minimum of 1-2 x/day, 45 to 90 minutes OT Frequency: 5 out of 7 days OT Duration/Estimated Length of Stay: 7-10 days OT Treatment/Interventions: Balance/vestibular training;Cognitive remediation/compensation;Community reintegration;Discharge planning;Functional mobility training;DME/adaptive equipment instruction;Neuromuscular re-education;Pain management;Patient/family education;Psychosocial support;Therapeutic Activities;UE/LE Coordination activities;UE/LE Strength taining/ROM;Self  Care/advanced ADL retraining;Therapeutic Exercise OT Self Feeding Anticipated Outcome(s): independent OT Basic Self-Care Anticipated Outcome(s): supervision  OT Toileting Anticipated Outcome(s): supervision OT Bathroom Transfers Anticipated Outcome(s): supervision OT Recommendation Patient destination: Home (Will need to go to daughters house with 24 hour supervision.) Follow Up Recommendations: Home health OT Equipment Recommended: Tub/shower seat;3 in 1 bedside comode   OT Evaluation Precautions/Restrictions  Precautions Precautions: Fall Precaution Comments: decreased intellectual awareness Restrictions Weight Bearing Restrictions: No  Vital Signs Therapy Vitals BP: 118/68 mmHg Pain Pain Assessment Pain Assessment: No/denies pain Pain Score: 0-No pain Home Living/Prior Functioning Home Living Available Help at Discharge: Family;Available PRN/intermittently Type of Home: Apartment Home Access: Level entry Home Layout: One level Additional Comments: Pt lives in [place] but is down her visitiing his daughter and his wife who is at a memory unit in [place] secondary to dementia. IADL History Current License: Yes Occupation: Retired Prior Function Level of Independence: Independent with basic ADLs;Requires assistive device for independence Driving: Yes Vocation: Retired Comments: cane ADL  See FIM scale  Vision/Perception  Vision - History Baseline Vision: Wears glasses only for reading Patient Visual Report: No change from baseline Vision - Assessment Vision Assessment: Vision tested Tracking/Visual Pursuits: Able to track stimulus in all quads without difficulty Visual Fields: No apparent deficits;Other (comment) (with gross testing will continue to monitor in therapies) Perception Perception: Within Functional Limits Praxis Praxis: Intact  Cognition Overall Cognitive Status: Impaired/Different from baseline Arousal/Alertness: Awake/alert Orientation Level:  Oriented X4 Attention: Sustained;Focused Sustained Attention: Impaired Sustained Attention Impairment: Functional basic Memory: Impaired Memory Impairment: Decreased recall of new information;Decreased short term memory Decreased Short Term Memory: Verbal complex Awareness: Impaired Awareness Impairment: Intellectual impairment Problem Solving: Impaired Problem Solving Impairment: Functional complex;Functional basic Executive Function: Organizing;Self Correcting Organizing: Impaired Self Correcting: Impaired Behaviors: Restless Safety/Judgment: Impaired Comments: Pt does not exhibit awareness of balance deficits.  He was able to state reason for hospitalization as well as all orientation questions.  Pt noted repeating things frequently during session such as his having a previous CVA 5 yrs ago and him needing to get out of the hospital to see his wife.  IIitially therapist was not aware of the need for pt to wear incontinence brief but pt decided he might need it in case he had an accident.  Pt needed to remove shoes and pants to donn, however pt was attempting to donn them over his pants instead.  Needed mod instructional cues for awareness as well as orienting pants when putting them back on. Sensation Sensation Light Touch: Appears Intact Stereognosis: Appears Intact Hot/Cold: Appears Intact Proprioception: Appears Intact Coordination Gross Motor Movements are Fluid and Coordinated: Yes Fine Motor Movements are Fluid and Coordinated: Yes Motor  Motor Motor: Abnormal postural alignment and control Motor - Skilled Clinical Observations: Pt with increased trunk and thoracic flexion in sitting.  Frequent LOB posteriorly as well. Mobility  Bed Mobility Bed Mobility: Supine to Sit Supine to Sit: 5: Supervision Supine to Sit Details: Verbal cues for technique Transfers Transfers: Sit to Stand;Stand to Sit Sit to Stand: 4: Min assist;From toilet;With upper extremity assist Sit to  Stand Details: Verbal cues for technique Stand to Sit: 4: Min assist;With upper extremity assist;To toilet Stand to Sit Details (indicate cue type and reason): Verbal cues for technique  Trunk/Postural Assessment  Cervical Assessment Cervical Assessment: Exceptions to [facility] Cervical Strength Overall Cervical Strength Comments: Pt presents with cervical protraction in sitting. Thoracic Assessment Thoracic Assessment: Exceptions to [facility] Thoracic Strength Overall Thoracic Strength Comments: Pt with mild thoracic kyphosis in standing. Lumbar Assessment Lumbar Assessment: Exceptions to [facility] Lumbar Strength Overall Lumbar  Strength Comments: Pt with posterior pelvic tilt in sitting. Postural Control Postural Control: Deficits on evaluation Protective Responses: Pt with delayed balance reactions in standing.  Balance Balance Balance Assessed: Yes Static Sitting Balance Static Sitting - Balance Support: No upper extremity supported Static Sitting - Level of Assistance: 6: Modified independent (Device/Increase time) Dynamic Sitting Balance Dynamic Sitting - Balance Support: No upper extremity supported Dynamic Sitting - Level of Assistance: 5: Stand by assistance Static Standing Balance Static Standing - Balance Support: No upper extremity supported Static Standing - Level of Assistance: 4: Min assist Dynamic Standing Balance Dynamic Standing - Balance Support: No upper extremity supported Dynamic Standing - Level of Assistance: 4: Min assist Extremity/Trunk Assessment RUE Assessment RUE Assessment: Within Functional Limits LUE Assessment LUE Assessment: Within Functional Limits (Very miinimal dysmetria noted but not significant to establish impairment.)  FIM:  FIM - Eating Eating Activity: 0: Activity did not occur FIM - Grooming Grooming Steps: Wash, rinse, dry face;Wash, rinse, dry hands;Oral care, brush teeth, clean dentures;Brush, comb hair Grooming: 4: Steadying assist  or  patient completes 3 of 4 or 4 of 5 steps FIM - Bathing Bathing Steps Patient Completed: Chest;Left upper leg;Right lower leg (including foot);Right Arm;Left Arm;Left lower leg (including foot);Abdomen;Buttocks;Front perineal area;Right upper leg Bathing: 4: Steadying assist FIM - Upper Body Dressing/Undressing Upper body dressing/undressing steps patient completed: Thread/unthread right sleeve of pullover shirt/dresss;Thread/unthread left sleeve of pullover shirt/dress;Put head through opening of pull over shirt/dress;Pull shirt over trunk Upper body dressing/undressing: 5: Supervision: Safety issues/verbal cues FIM - Lower Body Dressing/Undressing Lower body dressing/undressing steps patient completed: Thread/unthread right underwear leg;Thread/unthread left underwear leg;Pull underwear up/down;Thread/unthread right pants leg;Thread/unthread left pants leg;Don/Doff right shoe;Fasten/unfasten left shoe;Fasten/unfasten right shoe;Fasten/unfasten pants;Don/Doff left shoe;Pull pants up/down Lower body dressing/undressing: 4: Min-Patient completed 75 plus % of tasks FIM - Toileting Toileting steps completed by patient: Adjust clothing prior to toileting;Performs perineal hygiene;Adjust clothing after toileting Toileting: 4: Steadying assist FIM - Bed/Chair Transfer Bed/Chair Transfer: 5: Supine > Sit: Supervision (verbal cues/safety issues);4: Bed > Chair or W/C: Min A (steadying Pt. > 75%);4: Chair or W/C > Bed: Min A (steadying Pt. > 75%) FIM - Radio producer Devices: Elevated toilet seat Toilet Transfers: 4-To toilet/BSC: Min A (steadying Pt. > 75%);4-From toilet/BSC: Min A (steadying Pt. > 75%) FIM - Tub/Shower Transfers Tub/shower Transfers: 0-Activity did not occur or was simulated   Refer to Care Plan for Long Term Goals  Recommendations for other services: None  Discharge Criteria: Patient will be discharged from OT if patient refuses treatment 3 consecutive  times without medical reason, if treatment goals not met, if there is a change in medical status, if patient makes no progress towards goals or if patient is discharged from hospital.  The above assessment, treatment plan, treatment alternatives and goals were discussed and mutually agreed upon: by patient  Pt worked on bathing and dressing during session with emphasis on sequencing, balance and safety.  Pt does not acknowledge deficits with balance during session.    Pearl Berlinger OTR/L 03/19/2013, 11:58 AM

## 2013-03-19 NOTE — Progress Notes (Signed)
Social Work Patient ID: Alexander Johnston, male   DOB: 04/17/1924, 80 y.o.   MRN: [id number] Spoke with pt and daughter regarding team conference goals supervision level and discharge 2/10. Main goal is to get pt to agree To assisted living, he denies he needs this and wants to return home.  This has been a on-going issue with pt and family prior to Admission to the hospital.  Discussed with daughter can address competency issue and have MD discuss with pt recommendations of the team. Daughter would like MD to discuss with pt and the fact he can not drive when he leaves here.  Will work on discharge plans, which seems to be the most challenging.

## 2013-03-19 NOTE — Evaluation (Signed)
Physical Therapy Assessment and Plan  Patient Details  Name: Alexander Johnston MRN: [id number] Date of Birth: September 09, 1924  PT Diagnosis: Abnormal posture, Abnormality of gait, Ataxic gait, Cognitive deficits, Coordination disorder, Difficulty walking, Impaired cognition, Impaired sensation and Muscle weakness Rehab Potential: Good ELOS: 7-10 days   Today's Date: 03/19/2013 Time: 5885-0277 Time Calculation (min): 58 min  Problem List:  Patient Active Problem List   Diagnosis Date Noted  . CVA (cerebral infarction) 03/15/2013  . Diarrhea   . Rectal cancer 10/21/2012  . Colon cancer 10/17/2012  . HTN (hypertension) 10/15/2012  . Other and unspecified hyperlipidemia 10/15/2012  . Heart disease, unspecified 10/15/2012    Past Medical History:  Past Medical History  Diagnosis Date  . CAD (coronary artery disease)   . Hypertension   . Stroke   . GERD (gastroesophageal reflux disease)   . Diarrhea     intermittent past 4 years  . Colon cancer 10/17/12    adenocarcinoma  . IBS (irritable bowel syndrome)   . Hypothyroidism   . Abrasion of great toe of right foot   . Abrasion of second toe, right   . Abrasion of right lower leg    Past Surgical History:  Past Surgical History  Procedure Laterality Date  . Knee surgery      bilateral  . Colonoscopy w/ biopsies  10/17/12    rectosigmoid=adenocarcinoma  . Eus N/A 11/07/2012    Procedure: LOWER ENDOSCOPIC ULTRASOUND (EUS);  Surgeon: Milus Banister, MD;  Location: [facility];  Service: Endoscopy;  Laterality: N/A;  . Tee without cardioversion N/A 03/18/2013    Procedure: TRANSESOPHAGEAL ECHOCARDIOGRAM (TEE);  Surgeon: Larey [facility]er, MD;  Location: [facility];  Service: Cardiovascular;  Laterality: N/A;    Assessment & Plan Clinical Impression: Patient is a 80 y.o. year old male with recent admission to the hospital on with 03/15/13 with left sided numbness, fall and difficulty speaking. Patient reported to have similar symptoms few  days before that resolved on its own. MRI and MRA brain multiple small areas of acute infarct affecting right cerebellum right medulla, superior cerebellar vermis, right temporal lobe, right frontal and parietal lobe, right occipital lobe, right splenium of the corpus callosum as well as left frontal and parietal lobe.  Patient transferred to CIR on 03/18/2013 .   Patient currently requires min with mobility (intermittent Mod assist for gait without AD) secondary to muscle weakness, decreased cardiorespiratoy endurance, decreased coordination and decreased attention, decreased awareness, decreased problem solving, decreased safety awareness, decreased memory and delayed processing.  Noted some slight L inattention during mobility tasks.   Prior to hospitalization, patient was modified independent  with mobility and lived with   in a [place] home.  Home access is  Level entry.  Patient will benefit from skilled PT intervention to maximize safe functional mobility, minimize fall risk and decrease caregiver burden for planned discharge possibly ALF for 24/7 supervision.  Anticipate patient will benefit from follow up [place] at discharge.  PT - End of Session Activity Tolerance: Tolerates 30+ min activity with multiple rests Endurance Deficit: No Endurance Deficit Description: dyspnea 3/4 with mobility in the room duirng ADLs PT Assessment Rehab Potential: Good Barriers to Discharge: Decreased caregiver support Barriers to Discharge Comments: Daughter unable to provide 24/7 supervision PT Patient demonstrates impairments in the following area(s): Endurance;Motor;Safety;Balance PT Transfers Functional Problem(s): Bed Mobility;Car;Bed to Chair;Furniture PT Locomotion Functional Problem(s): Ambulation;Wheelchair Mobility;Stairs PT Plan PT Intensity: Minimum of 1-2 x/day ,45 to 90 minutes PT Frequency: 5  out of 7 days PT Duration Estimated Length of Stay: 7-10 days PT Treatment/Interventions:  Ambulation/gait training;Balance/vestibular training;Cognitive remediation/compensation;Discharge planning;Disease management/prevention;DME/adaptive equipment instruction;Functional mobility training;Neuromuscular re-education;Patient/family education;Stair training;Therapeutic Activities;Therapeutic Exercise;UE/LE Strength taining/ROM;UE/LE Coordination activities;Visual/perceptual remediation/compensation;Wheelchair propulsion/positioning PT Transfers Anticipated Outcome(s): supervision PT Locomotion Anticipated Outcome(s): supervision PT Recommendation Recommendations for Other Services: Neuropsych consult Follow Up Recommendations: 24 hour supervision/assistance;Home health PT (may need ALF) Patient destination: Assisted Living Equipment Recommended: Rolling walker with 5" wheels;To be determined  Skilled Therapeutic Intervention Pt received sitting in therapy gym, having just finished OT session.  Performed all initial assessment/evaluations for pt.  See details below.  Initiated gait training with use of RW x 155' with min assist.  Note pt requires cues for upright posture, maintaining position inside of RW, increased stride length and increased heel to toe contact, as he demonstrates foot flat pattern.  During session, pt noted to be incontinent of bowel (pt unaware), therefore had pt self propel back to room to use restroom, change brief and don new pair of pants.  Pt continues to attempt mobility without supervision with max cues for calling for assist when ready to stand.  Returned to gym and performed BERG balance test with score of 17/56, see details below.  Educated and discussed importance of score related to pt having 100% chance of having a fall.  Pt continues to deny any balance deficits and states "I don't do those things at home" and "I am really careful at home."  Pt ambulated back to room with RW as stated above and was left in bed with 3 bedrails and bed alarm set.  All needs in  reach.   PT Evaluation Precautions/Restrictions Precautions Precautions: Fall Precaution Comments: decreased intellectual awareness Restrictions Weight Bearing Restrictions: No General Chart Reviewed: Yes Family/Caregiver Present: No Vital SignsTherapy Vitals Temp: 98.4 F (36.9 C) Temp src: Oral Pulse Rate: 76 Resp: 16 BP: 117/61 mmHg Patient Position, if appropriate: Lying Oxygen Therapy SpO2: 98 % Pain Pain Assessment Pain Assessment: No/denies pain Pain Score: 0-No pain Home Living/Prior Functioning Home Living Living Arrangements: Alone Available Help at Discharge: Family;Available PRN/intermittently (states daughter and son in law may come stay with him) Type of Home: Apartment Home Access: Level entry Home Layout: One level Additional Comments: Pt lives in [place] but is down her visitiing his daughter and his wife who is at a memory unit in [place] secondary to dementia. Prior Function Level of Independence: Independent with basic ADLs;Requires assistive device for independence Driving: Yes Vocation: Retired Leisure: Hobbies-yes (Comment) Comments: cane, likes to go to grandson baseball games Vision/Perception  Vision - History Baseline Vision: Wears glasses only for reading Patient Visual Report: No change from baseline Vision - Assessment Vision Assessment: Vision tested Tracking/Visual Pursuits: Able to track stimulus in all quads without difficulty Visual Fields: No apparent deficits;Other (comment) (with gross testing will continue to monitor in therapies) Perception Perception: Within Functional Limits Praxis Praxis: Intact  Cognition Overall Cognitive Status: Impaired/Different from baseline Arousal/Alertness: Awake/alert Orientation Level: Oriented X4 Attention: Sustained;Focused Focused Attention: Appears intact Sustained Attention: Impaired Sustained Attention Impairment: Functional basic Memory: Impaired Memory Impairment: Decreased  recall of new information;Decreased short term memory Decreased Short Term Memory: Verbal complex Awareness: Impaired Awareness Impairment: Intellectual impairment Problem Solving: Impaired Problem Solving Impairment: Functional complex;Functional basic Behaviors: Impulsive Safety/Judgment: Impaired Comments: Pt demonstrates zero awareness of balance deficits and states "my balance has been off for several years, but I am very careful."  Performed BERG balance test with score of 17/56 and explained what  this meant, however pt excuses this with "I never do those things."   Sensation Sensation Light Touch: Impaired Detail Light Touch Impaired Details: Impaired LLE;Impaired RLE Stereognosis: Not tested Hot/Cold: Not tested Proprioception: Appears Intact Additional Comments: pt with absent sensation in B feet Coordination Gross Motor Movements are Fluid and Coordinated: Yes Fine Motor Movements are Fluid and Coordinated: Yes Heel Shin Test: Pt demonstrates decreased fluidity of movement, however no dysmetria noted.  Motor  Motor Motor: Abnormal postural alignment and control Motor - Skilled Clinical Observations: Pt with increased trunk and thoracic flexion in sitting.  Frequent LOB posteriorly as well.  Mobility Bed Mobility Bed Mobility: Supine to Sit;Sit to Supine Supine to Sit: 5: Supervision Supine to Sit Details: Verbal cues for technique Sit to Supine: 5: Supervision Sit to Supine - Details: Verbal cues for technique Transfers Transfers: Yes Sit to Stand: 4: Min assist;From chair/3-in-1;From toilet Sit to Stand Details: Verbal cues for technique;Verbal cues for precautions/safety Stand to Sit: 4: Min guard;To chair/3-in-1;To toilet Stand to Sit Details (indicate cue type and reason): Verbal cues for technique;Verbal cues for precautions/safety Stand Pivot Transfers: 4: Min assist Stand Pivot Transfer Details: Verbal cues for sequencing;Verbal cues for technique;Verbal cues  for precautions/safety Locomotion  Ambulation Ambulation: Yes Ambulation/Gait Assistance: 4: Min assist;3: Mod assist Ambulation Distance (Feet): 155 Feet Assistive device: None;Rolling walker Ambulation/Gait Assistance Details: Verbal cues for sequencing;Verbal cues for technique;Verbal cues for precautions/safety;Verbal cues for gait pattern (tactile cues for posture) Ambulation/Gait Assistance Details: Pt ambulates with foot flat gait pattern with short shuffled steps.  Provided cues for correct positioning inside of RW and also for upright posture, increased stride length, and heel to toe contact.  Gait Gait: Yes Gait Pattern: Impaired Gait Pattern: Right foot flat;Left foot flat;Step-through pattern;Trunk flexed;Shuffle Stairs / Additional Locomotion Stairs: Yes Stairs Assistance: 4: Min assist Stairs Assistance Details: Verbal cues for sequencing;Verbal cues for technique;Verbal cues for precautions/safety Stair Management Technique: Two rails;Alternating pattern;Forwards Number of Stairs: 5 Height of Stairs: 4 Architect: Yes Wheelchair Assistance: 5: Investment banker, operational Details: Verbal cues for sequencing;Verbal cues for Marketing executive: Both upper extremities Wheelchair Parts Management: Supervision/cueing Distance: 155  Trunk/Postural Assessment  Cervical Assessment Cervical Assessment: Exceptions to [facility] Cervical Strength Overall Cervical Strength Comments: Pt presents with cervical protraction in sitting. Thoracic Assessment Thoracic Assessment: Exceptions to [facility] Thoracic Strength Overall Thoracic Strength Comments: Pt with mild thoracic kyphosis in standing. Lumbar Assessment Lumbar Assessment: Exceptions to [facility] Lumbar Strength Overall Lumbar Strength Comments: Pt with posterior pelvic tilt in sitting. Postural Control Postural Control: Deficits on evaluation Protective Responses: Pt with delayed balance  reactions in standing and no hip or stepping strategy.   Balance Balance Balance Assessed: Yes Standardized Balance Assessment Standardized Balance Assessment: Berg Balance Test Berg Balance Test Sit to Stand: Able to stand  independently using hands Standing Unsupported: Able to stand 2 minutes with supervision Sitting with Back Unsupported but Feet Supported on Floor or Stool: Able to sit safely and securely 2 minutes Stand to Sit: Controls descent by using hands Transfers: Able to transfer with verbal cueing and /or supervision Standing Unsupported with Eyes Closed: Needs help to keep from falling Standing Ubsupported with Feet Together: Needs help to attain position and unable to hold for 15 seconds From Standing, Reach Forward with Outstretched Arm: Loses balance while trying/requires external support From Standing Position, Pick up Object from Floor: Unable to try/needs assist to keep balance From Standing Position, Turn to Look Behind Over each Shoulder:  Turn sideways only but maintains balance Turn 360 Degrees: Needs assistance while turning Standing Unsupported, Alternately Place Feet on Step/Stool: Needs assistance to keep from falling or unable to try Standing Unsupported, One Foot in Front: Loses balance while stepping or standing Standing on One Leg: Unable to try or needs assist to prevent fall Total Score: 17 Static Sitting Balance Static Sitting - Balance Support: No upper extremity supported Static Sitting - Level of Assistance: 6: Modified independent (Device/Increase time) Dynamic Sitting Balance Dynamic Sitting - Balance Support: No upper extremity supported Dynamic Sitting - Level of Assistance: 5: Stand by assistance Static Standing Balance Static Standing - Balance Support: No upper extremity supported Static Standing - Level of Assistance: 4: Min assist;3: Mod assist (pt demonstrates posterior lean in standing. ) Dynamic Standing Balance Dynamic Standing -  Balance Support: No upper extremity supported Dynamic Standing - Level of Assistance: 4: Min assist Extremity Assessment  RUE Assessment RUE Assessment: Within Functional Limits LUE Assessment LUE Assessment: Within Functional Limits (Very miinimal dysmetria noted but not significant to establish impairment.) RLE Assessment RLE Assessment: Within Functional Limits LLE Assessment LLE Assessment: Exceptions to [facility] LLE Strength LLE Overall Strength: Deficits LLE Overall Strength Comments: pts strength grossly WFL, however demostrates 3/5 hip flex strength  FIM:  FIM - Bed/Chair Transfer Bed/Chair Transfer Assistive Devices: Arm rests Bed/Chair Transfer: 5: Supine > Sit: Supervision (verbal cues/safety issues);4: Bed > Chair or W/C: Min A (steadying Pt. > 75%);4: Chair or W/C > Bed: Min A (steadying Pt. > 75%);5: Sit > Supine: Supervision (verbal cues/safety issues) FIM - Locomotion: Wheelchair Distance: 155 Locomotion: Wheelchair: 5: Travels 150 ft or more: maneuvers on rugs and over door sills with supervision, cueing or coaxing FIM - Locomotion: Ambulation Locomotion: Ambulation Assistive Devices: Administrator (and without AD) Ambulation/Gait Assistance: 4: Min assist;3: Mod assist Locomotion: Ambulation: 3: Travels 150 ft or more with moderate assistance (Pt: 50 - 74%) FIM - Locomotion: Stairs Locomotion: Scientist, physiological: Hand rail - 2 Locomotion: Stairs: 2: Up and Down 4 - 11 stairs with minimal assistance (Pt.>75%)   Refer to Care Plan for Long Term Goals  Recommendations for other services: Neuropsych  Discharge Criteria: Patient will be discharged from PT if patient refuses treatment 3 consecutive times without medical reason, if treatment goals not met, if there is a change in medical status, if patient makes no progress towards goals or if patient is discharged from hospital.  The above assessment, treatment plan, treatment alternatives and goals were discussed  and mutually agreed upon: by patient  Denice Bors 03/19/2013, 3:28 PM

## 2013-03-19 NOTE — Care Management Note (Signed)
[facility] Individual Statement of Services  Patient Name:  Alexander Johnston  Date:  03/19/2013  Welcome to the [facility].  Our goal is to provide you with an individualized program based on your diagnosis and situation, designed to meet your specific needs.  With this comprehensive rehabilitation program, you will be expected to participate in at least 3 hours of rehabilitation therapies Monday-Friday, with modified therapy programming on the weekends.  Your rehabilitation program will include the following services:  Physical Therapy (PT), Occupational Therapy (OT), Speech Therapy (ST), 24 hour per day rehabilitation nursing, Case Management (Social Worker), Rehabilitation Medicine, Nutrition Services and Pharmacy Services  Weekly team conferences will be held on Wednesday to discuss your progress.  Your Social Worker will talk with you frequently to get your input and to update you on team discussions.  Team conferences with you and your family in attendance may also be held.  Expected length of stay: 7 days  Overall anticipated outcome: Supervision level  Depending on your progress and recovery, your program may change. Your Social Worker will coordinate services and will keep you informed of any changes. Your Social Worker's name and contact numbers are listed  below.  The following services may also be recommended but are not provided by the [facility] will be made to provide these services after discharge if needed.  Arrangements include referral to agencies that provide these services.  Your insurance has been verified to be:  Medic[employer] & [place] Your primary doctor is:  Dr [name]  Pertinent information will be shared with your doctor and your insurance company.  Social Worker:  Ovidio Kin, [place] or ([phone]  Information discussed with and copy given to patient by: Elease Hashimoto, 03/19/2013, 2:32 PM

## 2013-03-19 NOTE — Progress Notes (Addendum)
Occupational Therapy Session Note  Patient Details  Name: Alexander Johnston MRN: [id number] Date of Birth: [birth year]  Today's Date: 03/19/2013 Time: 1330-1400 Time Calculation (min): 30 min  Short Term Goals: Week 1:  OT Short Term Goal 1 (Week 1): Goals set at supervision level for discharge.  No STGs set based on LOS.  Skilled Therapeutic Interventions/Progress Updates:  Pt worked on functional mobility to the gym to begin session.  Pt needing min assist to walk without assistive device.  He demonstrates increased weaving from side to side with mobility but when asked if he feels his walking is different he just attributes it to not being up the past day.  In the gym worked on standing balance using firm surface and foam soft surface.  Pt with LOB when standing with feet together and eyes closed.  Noted increased swaying even with his eyes open.  Pt reports that he had trouble standing with eyes closed prior to this event and stated that when he goes to church and prays in standing he has to hold onto the pew or pray with his eyes closed.  Progressed to standing on foam surface, in which pt needed min assist to step up on and min assist to maintain balance with simple head movements.  When pt loses his balance he responds by saying "I would never stand on something like this at home." basically discrediting the fact that he has balance issues.  Finished session by having pt toss and catch various size balls in sitting on EOM.  He was able to catch the larger beach ball with supervision but experienced greater difficulty with trying to catch a basketball or baseball when tossing it back and forth between his hands. Of note at beginning of the session pt was lying in bed and stated that he had took off his safety belt in the chair and transferred without assistance to the bed.  Bed alarm was on when therapist came in.      Therapy Documentation Precautions:  Precautions Precautions: Fall Precaution  Comments: decreased intellectual awareness Restrictions Weight Bearing Restrictions: No  Pain: Pain Assessment Pain Assessment: No/denies pain ADL: See FIM for current functional status  Therapy/Group: Individual Therapy  Atanacio Melnyk OTR/L 03/19/2013, 2:58 PM

## 2013-03-19 NOTE — Progress Notes (Signed)
Subjective/Complaints: 80 y.o. male with history of CAD, IBS, CVA, who was admitted on 03/15/13 with left sided numbness, fall and difficulty speaking. Patient reported to have similar symptoms few days before that resolved on its own. MRI and MRA brain multiple small areas of acute infarct affecting right cerebellum right medulla, superior  cerebellar vermis, right temporal lobe, right frontal and parietal lobe, right occipital lobe, right splenium of the corpus callosum as well as left frontal and parietal lobe. 2D echo with EF 11-88%, grade 1 diastolic dysfunction and no wall abnormality. Carotid dopplers without significant stenosis. Neurology consulted and recommended changing ASA to 325 mg/daily as well as TEE   TEE aortic arch and descending aorta plaque, no PFO  Review of Systems - Negative except poor sleep and constipation  Objective: Vital Signs: Blood pressure 118/68, pulse 64, temperature 98.3 F (36.8 C), temperature source Oral, resp. rate 17, height 6' 2"  (1.88 m), weight 86.3 kg (190 lb 4.1 oz), SpO2 95.00%. No results found. Results for orders placed during the hospital encounter of 03/18/13 (from the past 72 hour(s))  CBC WITH DIFFERENTIAL     Status: Abnormal   Collection Time    03/19/13  5:25 AM      Result Value Range   WBC 6.7  4.0 - 10.5 K/uL   RBC 4.21 (*) 4.22 - 5.81 MIL/uL   Hemoglobin 12.4 (*) 13.0 - 17.0 g/dL   HCT 37.3 (*) 39.0 - 52.0 %   MCV 88.6  78.0 - 100.0 fL   MCH 29.5  26.0 - 34.0 pg   MCHC 33.2  30.0 - 36.0 g/dL   RDW 16.4 (*) 11.5 - 15.5 %   Platelets 270  150 - 400 K/uL   Neutrophils Relative % 60  43 - 77 %   Neutro Abs 4.0  1.7 - 7.7 K/uL   Lymphocytes Relative 15  12 - 46 %   Lymphs Abs 1.0  0.7 - 4.0 K/uL   Monocytes Relative 13 (*) 3 - 12 %   Monocytes Absolute 0.9  0.1 - 1.0 K/uL   Eosinophils Relative 11 (*) 0 - 5 %   Eosinophils Absolute 0.8 (*) 0.0 - 0.7 K/uL   Basophils Relative 1  0 - 1 %   Basophils Absolute 0.1  0.0 - 0.1 K/uL   COMPREHENSIVE METABOLIC PANEL     Status: Abnormal   Collection Time    03/19/13  5:25 AM      Result Value Range   Sodium 142  137 - 147 mEq/L   Potassium 4.4  3.7 - 5.3 mEq/L   Chloride 106  96 - 112 mEq/L   CO2 24  19 - 32 mEq/L   Glucose, Bld 97  70 - 99 mg/dL   BUN 23  6 - 23 mg/dL   Creatinine, Ser 0.86  0.50 - 1.35 mg/dL   Calcium 8.8  8.4 - 10.5 mg/dL   Total Protein 5.9 (*) 6.0 - 8.3 g/dL   Albumin 2.9 (*) 3.5 - 5.2 g/dL   AST 21  0 - 37 U/L   ALT 14  0 - 53 U/L   Alkaline Phosphatase 64  39 - 117 U/L   Total Bilirubin 0.2 (*) 0.3 - 1.2 mg/dL   GFR calc non Af Amer 76 (*) >90 mL/min   GFR calc Af Amer 88 (*) >90 mL/min   Comment: (NOTE)     The eGFR has been calculated using the CKD EPI equation.  This calculation has not been validated in all clinical situations.     eGFR's persistently <90 mL/min signify possible Chronic Kidney     Disease.      General: No acute distress Mood and affect are appropriate Heart: Regular rate and rhythm no rubs murmurs or extra sounds Lungs: Clear to auscultation, breathing unlabored, no rales or wheezes, fine crackles at bases Abdomen: Positive bowel sounds, soft nontender to palpation, nondistended Extremities: No clubbing, cyanosis, or edema Skin: No evidence of breakdown, no evidence of rash Neurologic: Cranial nerves II through XII intact, motor strength is 5/5 in bilateral deltoid, bicep, tricep, grip, hip flexor, knee extensors, ankle dorsiflexor and plantar flexor Sensory exam normal sensation to light touch and proprioception in bilateral upper and reduce LT lower extremities Cerebellar exam normal finger to nose to finger mild dysmetria heel to shin in bilateral upper and lower extremities Musculoskeletal: Full range of motion in all 4 extremities. No joint swelling   Assessment/Plan: 1. Functional deficits secondary to embolic multiple small areas of acute infarct affecting right cerebellum right medulla, superior   cerebellar vermis, right temporal lobe, right frontal and parietal lobe, right occipital lobe, right splenium of the corpus callosum as well as left frontal and parietal lobe.  which require 3+ hours per day of interdisciplinary therapy in a comprehensive inpatient rehab setting. Physiatrist is providing close team supervision and 24 hour management of active medical problems listed below. Physiatrist and rehab team continue to assess barriers to discharge/monitor patient progress toward functional and medical goals. FIM:                                  Medical Problem List and Plan:  1. DVT Prophylaxis/Anticoagulation: Pharmaceutical: Lovenox  2. Pain Management: prn tylenol.  3. Mood: Improvement in anxiety levels today. Provide ego support. LCSW to follow for evaluation and support.  4. Neuropsych: This patient is not capable of making decisions on his own behalf.  5. Multiple abrasions: right lower leg, left shin and well as Right 1st and 2nd toes.  6. Colon cancer: History of intermittent diarrhea.  7. HTN: Will monitor with bid checks. Continue Cardizem, Norvasc and Losartan.  8. Dyslipidemia: continue Crestor.    LOS (Days) 1 A FACE TO FACE EVALUATION WAS PERFORMED  KIRSTEINS,ANDREW E 03/19/2013, 8:53 AM

## 2013-03-19 NOTE — Progress Notes (Signed)
Social Work Assessment and Plan Social Work Assessment and Plan  Patient Details  Name: Alexander Johnston MRN: [id number] Date of Birth: 10/19/1924  Today's Date: 03/19/2013  Problem List:  Patient Active Problem List   Diagnosis Date Noted  . CVA (cerebral infarction) 03/15/2013  . Diarrhea   . Rectal cancer 10/21/2012  . Colon cancer 10/17/2012  . HTN (hypertension) 10/15/2012  . Other and unspecified hyperlipidemia 10/15/2012  . Heart disease, unspecified 10/15/2012   Past Medical History:  Past Medical History  Diagnosis Date  . CAD (coronary artery disease)   . Hypertension   . Stroke   . GERD (gastroesophageal reflux disease)   . Diarrhea     intermittent past 4 years  . Colon cancer 10/17/12    adenocarcinoma  . IBS (irritable bowel syndrome)   . Hypothyroidism   . Abrasion of great toe of right foot   . Abrasion of second toe, right   . Abrasion of right lower leg    Past Surgical History:  Past Surgical History  Procedure Laterality Date  . Knee surgery      bilateral  . Colonoscopy w/ biopsies  10/17/12    rectosigmoid=adenocarcinoma  . Eus N/A 11/07/2012    Procedure: LOWER ENDOSCOPIC ULTRASOUND (EUS);  Surgeon: Milus Banister, MD;  Location: [facility];  Service: Endoscopy;  Laterality: N/A;  . Tee without cardioversion N/A 03/18/2013    Procedure: TRANSESOPHAGEAL ECHOCARDIOGRAM (TEE);  Surgeon: Larey Dresser, MD;  Location: [facility];  Service: Cardiovascular;  Laterality: N/A;   Social History:  reports that he has never smoked. He has never used smokeless tobacco. He reports that he does not drink alcohol or use illicit drugs.  Family / Support Systems Marital Status: Married Patient Roles: Spouse;Parent;Volunteer Spouse/Significant Other: Wife is Memory care unit-[facility] Children: Celeste Bailey-daughter  [phone]-cell Other Supports: Other children not local Anticipated Caregiver: Daughter wants him to go to ALF from  here Ability/Limitations of Caregiver: Pt needs 24 hr supervision due to balance and awareness deficits. Caregiver Availability: Other (Comment) (Want him to go to ALF working on this) Family Dynamics: Close knit family pt has been independent all of his life and cared for others.  He feels he can remain in control of this and doesn;t want to pursue any other alternative than going home.  Social History Preferred language: English Religion: Baptist Cultural Background: No issues Education: [employer] Educated Read: Yes Write: Yes Employment Status: Retired Freight forwarder Issues: No issues Guardian/Conservator: Daughter working on [employer] for pt.  MD does not feel pt is capable of making his own decisions while here will look toward daughter since she is next of kin   Abuse/Neglect Physical Abuse: Denies Verbal Abuse: Denies Sexual Abuse: Denies Exploitation of patient/patient's resources: Denies Self-Neglect: Denies  Emotional Status Pt's affect, behavior adn adjustment status: Pt is motivated to get out of the hospital and get back home where he can do for himself.  He feels it was a mistake getting into the ambulance and coming to the hospital.  He feels he has a light TIA and his deficits are gone.  He misses his wife and wants to see her like he usually does. Recent Psychosocial Issues: Other medical issues-awareness and balance issues Pyschiatric History: No history-deferred depression screen at this time due to tired and wanting to take a nap and upset regarding missing his wife.  He normally visits her every other day in the memory care unit.  Will ask Neuro-psych to  see regarding testing and competency. Substance Abuse History: No issues  Patient / Family Perceptions, Expectations & Goals Pt/Family understanding of illness & functional limitations: Pt and daughter can expalin his stroke and deficits, but pt feels they have resolved and lacks awareness.  Daughter is aware  of team's recommendations and on board biggest obstacle is getting pt to agree.  Will aks MD to talk with and work on him during his stay. Premorbid pt/family roles/activities: Husband, Father, Retiree, Missionary, grandfather, etc Anticipated changes in roles/activities/participation: resume Pt/family expectations/goals: Pt states: " I am fine I want to go back home, I don't have balance issues."  Daughter states; " I am on board and agree with assisting living but he is stubborn and wants to be in charge."  [employer]: Other (Comment) (Wife in [place] and pt was in I living until left) Premorbid Home Care/DME Agencies: None Transportation available at discharge: family Resource referrals recommended: Support group (specify) (CVA SUpport group)  Discharge Planning Living Arrangements: Alone Support Systems: Spouse/significant other;Children;Friends/neighbors;Church/faith community Type of Residence: Private residence Insurance Resources: Education officer, museum (specify) Web designer) Financial Resources: Radio broadcast assistant Screen Referred: No Living Expenses: Rent Money Management: Patient Does the patient have any problems obtaining your medications?: No Home Management: Self Patient/Family Preliminary Plans: Pt wants to retun to apartment, while daughter wants him to go to ALF when dishcarged from here.  Pt unawar eof his inability to drive now and will not be able to visit wife now.  Will have MD talk with pt and address competency issue.  Encouraged daugther to talk with pt also about her concerns and inability to return home form here. Social Work Anticipated Follow Up Needs: HH/OP;ALF/IL;Support Group  Clinical Impression Pleasant gentleman who has been in charge his whole life and now is being told what to do and he is resisting it.  He lacks awareness into his deficits and would be unsafe returning home alone. Will work with daughter on  discharge plan and try to convince pt ALF is best option.  Will have MD discuss with pt also, since he listens more to MD than family members.  With his inability to drive He will not be able to visit wife in [place], so will  Discuss being closer to her.  Work on a safe discharge plan.  Elease Hashimoto 03/19/2013, 3:02 PM

## 2013-03-19 NOTE — Progress Notes (Signed)
Patient information reviewed and entered into eRehab system by Jovanne Riggenbach, RN, CRRN, PPS Coordinator.  Information including medical coding and functional independence measure will be reviewed and updated through discharge.     Per nursing patient was given "Data Collection Information Summary for Patients in Inpatient Rehabilitation Facilities with attached "Privacy Act Statement-Health Care Records" upon admission.  

## 2013-03-20 ENCOUNTER — Inpatient Hospital Stay (HOSPITAL_COMMUNITY): Payer: Medicare Other | Admitting: Speech Pathology

## 2013-03-20 ENCOUNTER — Inpatient Hospital Stay (HOSPITAL_COMMUNITY): Payer: Medicare Other | Admitting: Rehabilitation

## 2013-03-20 ENCOUNTER — Encounter (HOSPITAL_COMMUNITY): Payer: Medicare Other | Admitting: Occupational Therapy

## 2013-03-20 MED ORDER — BISACODYL 5 MG PO TBEC
5.0000 mg | DELAYED_RELEASE_TABLET | Freq: Two times a day (BID) | ORAL | Status: DC | PRN
  Administered 2013-03-20: 5 mg via ORAL
  Filled 2013-03-20: qty 1

## 2013-03-20 MED ORDER — BACITRACIN 500 UNIT/GM EX OINT
1.0000 "application " | TOPICAL_OINTMENT | Freq: Two times a day (BID) | CUTANEOUS | Status: DC
  Administered 2013-03-20 – 2013-03-25 (×10): 1 via TOPICAL
  Filled 2013-03-20: qty 0.9

## 2013-03-20 NOTE — Progress Notes (Signed)
The skilled treatment note has been reviewed and SLP is in agreement. Avah Bashor, M.A., CCC-SLP [phone]  

## 2013-03-20 NOTE — Progress Notes (Signed)
Social Work Patient ID: Alexander Johnston, male   DOB: Feb 27, 1924, 80 y.o.   MRN: [id number] Met with daughter-Celeste to discuss discharge plan.  She informed worker Dad is going to [facility] At discharge since no assisted living beds at this time.  They do have levels of care and she will make sure pt has the care he needs. She was very appreciative of MD for speaking with pt regarding no driving and deficits from the multiple strokes he has had.   She will observe in therapies today and work toward discharge Tuesday.

## 2013-03-20 NOTE — Progress Notes (Signed)
Occupational Therapy Session Note  Patient Details  Name: Alexander Johnston MRN: [id number] Date of Birth: 1924/04/21  Today's Date: 03/20/2013 Time: 1005-1100 Time Calculation (min): 55 min  Short Term Goals: Week 1:  OT Short Term Goal 1 (Week 1): Goals set at supervision level for discharge.  No STGs set based on LOS.  Skilled Therapeutic Interventions/Progress Updates:    Pt performed bathing and dressing during session.  Needed encouragement to attempt shower this am but finally agreed.  Pt overall min assist for transfer to shower without assistive device.  Maintains cervical flexion and trunk flexion with mobility.  Pt perseverating at times on being cold so therapist kept water running on his back during shower.  Pt bathed sit to stand with close supervision using the grab bars for balance when standing.  Pt performed transfer back out to the bedside chair with min assist as well.  Performed dressing sit to stand with min steady assist.  Pt forgot when standing to pull his pants over his hips and after tucking in his shirt, sat back down and needed cueing to stand and finish pulling up his pants.  Pt stated "Oh I was just tucking in my shirt." Discounting the fact that he had forgotten to pull up his pants.    Therapy Documentation Precautions:  Precautions Precautions: Fall Precaution Comments: decreased intellectual awareness Restrictions Weight Bearing Restrictions: No  Pain: Pain Assessment Pain Assessment: No/denies pain ADL: See FIM for current functional status  Therapy/Group: Individual Therapy  Audria Takeshita OTR/L 03/20/2013, 1:01 PM

## 2013-03-20 NOTE — Progress Notes (Signed)
Subjective/Complaints: 80 y.o. male with history of CAD, IBS, CVA, who was admitted on 03/15/13 with left sided numbness, fall and difficulty speaking. Patient reported to have similar symptoms few days before that resolved on its own. MRI and MRA brain multiple small areas of acute infarct affecting right cerebellum right medulla, superior  cerebellar vermis, right temporal lobe, right frontal and parietal lobe, right occipital lobe, right splenium of the corpus callosum as well as left frontal and parietal lobe. 2D echo with EF 29-19%, grade 1 diastolic dysfunction and no wall abnormality. Carotid dopplers without significant stenosis. Neurology consulted and recommended changing ASA to 325 mg/daily as well as TEE   TEE aortic arch and descending aorta plaque, no PFO  Review of Systems - Negative except poor sleep and constipation  Objective: Vital Signs: Blood pressure 125/56, pulse 59, temperature 97.3 F (36.3 C), temperature source Oral, resp. rate 18, height 6' 2"  (1.88 m), weight 86.3 kg (190 lb 4.1 oz), SpO2 98.00%. No results found. Results for orders placed during the hospital encounter of 03/18/13 (from the past 72 hour(s))  CBC WITH DIFFERENTIAL     Status: Abnormal   Collection Time    03/19/13  5:25 AM      Result Value Range   WBC 6.7  4.0 - 10.5 K/uL   RBC 4.21 (*) 4.22 - 5.81 MIL/uL   Hemoglobin 12.4 (*) 13.0 - 17.0 g/dL   HCT 37.3 (*) 39.0 - 52.0 %   MCV 88.6  78.0 - 100.0 fL   MCH 29.5  26.0 - 34.0 pg   MCHC 33.2  30.0 - 36.0 g/dL   RDW 16.4 (*) 11.5 - 15.5 %   Platelets 270  150 - 400 K/uL   Neutrophils Relative % 60  43 - 77 %   Neutro Abs 4.0  1.7 - 7.7 K/uL   Lymphocytes Relative 15  12 - 46 %   Lymphs Abs 1.0  0.7 - 4.0 K/uL   Monocytes Relative 13 (*) 3 - 12 %   Monocytes Absolute 0.9  0.1 - 1.0 K/uL   Eosinophils Relative 11 (*) 0 - 5 %   Eosinophils Absolute 0.8 (*) 0.0 - 0.7 K/uL   Basophils Relative 1  0 - 1 %   Basophils Absolute 0.1  0.0 - 0.1 K/uL   COMPREHENSIVE METABOLIC PANEL     Status: Abnormal   Collection Time    03/19/13  5:25 AM      Result Value Range   Sodium 142  137 - 147 mEq/L   Potassium 4.4  3.7 - 5.3 mEq/L   Chloride 106  96 - 112 mEq/L   CO2 24  19 - 32 mEq/L   Glucose, Bld 97  70 - 99 mg/dL   BUN 23  6 - 23 mg/dL   Creatinine, Ser 0.86  0.50 - 1.35 mg/dL   Calcium 8.8  8.4 - 10.5 mg/dL   Total Protein 5.9 (*) 6.0 - 8.3 g/dL   Albumin 2.9 (*) 3.5 - 5.2 g/dL   AST 21  0 - 37 U/L   ALT 14  0 - 53 U/L   Alkaline Phosphatase 64  39 - 117 U/L   Total Bilirubin 0.2 (*) 0.3 - 1.2 mg/dL   GFR calc non Af Amer 76 (*) >90 mL/min   GFR calc Af Amer 88 (*) >90 mL/min   Comment: (NOTE)     The eGFR has been calculated using the CKD EPI equation.  This calculation has not been validated in all clinical situations.     eGFR's persistently <90 mL/min signify possible Chronic Kidney     Disease.      General: No acute distress Mood and affect are appropriate Heart: Regular rate and rhythm no rubs murmurs or extra sounds Lungs: Clear to auscultation, breathing unlabored, no rales or wheezes, fine crackles at bases Abdomen: Positive bowel sounds, soft nontender to palpation, nondistended Extremities: No clubbing, cyanosis, or edema Skin: No evidence of breakdown, no evidence of rash Neurologic: Cranial nerves II through XII intact, motor strength is 5/5 in bilateral deltoid, bicep, tricep, grip, hip flexor, knee extensors, ankle dorsiflexor and plantar flexor Sensory exam normal sensation to light touch and proprioception in bilateral upper and reduce LT lower extremities Cerebellar exam normal finger to nose to finger mild dysmetria heel to shin in bilateral upper and lower extremities Musculoskeletal: Full range of motion in all 4 extremities. No joint swelling   Assessment/Plan: 1. Functional deficits secondary to embolic multiple small areas of acute infarct affecting right cerebellum right medulla, superior   cerebellar vermis, right temporal lobe, right frontal and parietal lobe, right occipital lobe, right splenium of the corpus callosum as well as left frontal and parietal lobe.  which require 3+ hours per day of interdisciplinary therapy in a comprehensive inpatient rehab setting.  We duiscussed pt had a CVA in 9 areas of the brain which have caused not only balance issues but poor awareness of deficits  Also discussed need for assistance post CVA and no driving FIM: FIM - Bathing Bathing Steps Patient Completed: Chest;Left upper leg;Right lower leg (including foot);Right Arm;Left Arm;Left lower leg (including foot);Abdomen;Buttocks;Front perineal area;Right upper leg Bathing: 4: Steadying assist  FIM - Upper Body Dressing/Undressing Upper body dressing/undressing steps patient completed: Thread/unthread right sleeve of pullover shirt/dresss;Thread/unthread left sleeve of pullover shirt/dress;Put head through opening of pull over shirt/dress;Pull shirt over trunk Upper body dressing/undressing: 5: Supervision: Safety issues/verbal cues FIM - Lower Body Dressing/Undressing Lower body dressing/undressing steps patient completed: Thread/unthread right underwear leg;Thread/unthread left underwear leg;Pull underwear up/down;Thread/unthread right pants leg;Thread/unthread left pants leg;Don/Doff right shoe;Fasten/unfasten left shoe;Fasten/unfasten right shoe;Fasten/unfasten pants;Don/Doff left shoe;Pull pants up/down Lower body dressing/undressing: 4: Min-Patient completed 75 plus % of tasks  FIM - Toileting Toileting steps completed by patient: Adjust clothing prior to toileting;Performs perineal hygiene;Adjust clothing after toileting Toileting: 4: Steadying assist  FIM - Radio producer Devices: Elevated toilet seat Toilet Transfers: 4-To toilet/BSC: Min A (steadying Pt. > 75%);4-From toilet/BSC: Min A (steadying Pt. > 75%)  FIM - Bed/Chair Transfer Bed/Chair  Transfer Assistive Devices: Arm rests Bed/Chair Transfer: 5: Supine > Sit: Supervision (verbal cues/safety issues);4: Bed > Chair or W/C: Min A (steadying Pt. > 75%);4: Chair or W/C > Bed: Min A (steadying Pt. > 75%);5: Sit > Supine: Supervision (verbal cues/safety issues)  FIM - Locomotion: Wheelchair Distance: 155 Locomotion: Wheelchair: 5: Travels 150 ft or more: maneuvers on rugs and over door sills with supervision, cueing or coaxing FIM - Locomotion: Ambulation Locomotion: Ambulation Assistive Devices: Administrator (and without AD) Ambulation/Gait Assistance: 4: Min assist;3: Mod assist Locomotion: Ambulation: 3: Travels 150 ft or more with moderate assistance (Pt: 50 - 74%)  Comprehension Comprehension Mode: Auditory Comprehension: 5-Understands basic 90% of the time/requires cueing < 10% of the time  Expression Expression Mode: Verbal Expression: 5-Expresses complex 90% of the time/cues < 10% of the time  Social Interaction Social Interaction: 5-Interacts appropriately 90% of the time - Needs monitoring or  encouragement for participation or interaction.  Problem Solving Problem Solving: 5-Solves basic 90% of the time/requires cueing < 10% of the time  Memory Memory: 4-Recognizes or recalls 75 - 89% of the time/requires cueing 10 - 24% of the time  Medical Problem List and Plan:  1. DVT Prophylaxis/Anticoagulation: Pharmaceutical: Lovenox  2. Pain Management: prn tylenol.  3. Mood: Improvement in anxiety levels today. Provide ego support. LCSW to follow for evaluation and support.  4. Neuropsych: This patient is not capable of making decisions on his own behalf.  5. Multiple abrasions: right lower leg, left shin and well as Right 1st and 2nd toes.  6. Colon cancer: History of intermittent diarrhea.  7. HTN: Will monitor with bid checks. Continue Cardizem, Norvasc and Losartan.  8. Dyslipidemia: continue Crestor.    LOS (Days) 2 A FACE TO FACE EVALUATION WAS  PERFORMED  Marian Meneely E 03/20/2013, 7:51 AM

## 2013-03-20 NOTE — Progress Notes (Signed)
Speech Language Pathology Daily Session Note  Patient Details  Name: Alexander Johnston MRN: [id number] Date of Birth: 06-26-1924  Today's Date: 03/20/2013 Time: 1100-1150 Time Calculation (min): 50 min  Short Term Goals: Week 1: SLP Short Term Goal 1 (Week 1): Patient will complete complex problem solving tasks with supervision verbal cues. SLP Short Term Goal 2 (Week 1): Patient will verbilize two modifications he will make to increase safety upon discharge.   Skilled Therapeutic Interventions: Skilled therapeutic intervention addressed cognitive goals. Patient required Mod verbal and visual cues to complete complex money management task. Patient frequently made excuses that this was not a task he normally completes but was able to finish the task. While going to speech office, patient needed Total assist to recall which way to turn at the end of the hall. After session, patient's evaluation results were discussed with daughter. Continue with current plan of care.    FIM:  Comprehension Comprehension Mode: Auditory Comprehension: 5-Understands basic 90% of the time/requires cueing < 10% of the time Expression Expression Mode: Verbal Expression: 5-Expresses basic needs/ideas: With no assist Social Interaction Social Interaction: 5-Interacts appropriately 90% of the time - Needs monitoring or encouragement for participation or interaction. Problem Solving Problem Solving: 5-Solves basic 90% of the time/requires cueing < 10% of the time Memory Memory: 3-Recognizes or recalls 50 - 74% of the time/requires cueing 25 - 49% of the time FIM - Eating Eating Activity: 0: Activity did not occur  Pain Pain Assessment Pain Assessment: No/denies pain  Therapy/Group: Individual Therapy  Keriann Rankin 03/20/2013, 2:47 PM

## 2013-03-20 NOTE — Progress Notes (Signed)
Physical Therapy Session Note  Patient Details  Name: Alexander Johnston MRN: [id number] Date of Birth: April 27, 1924  Today's Date: 03/20/2013 Time: 2025-4270 Time Calculation (min): 33 min  Short Term Goals: Week 1:  PT Short Term Goal 1 (Week 1): =LTGs due to ELOS  Skilled Therapeutic Interventions/Progress Updates:   Pt received lying in bed this afternoon, agreeable to therapy.  Pt sat at EOB at supervision and donned shoes.  Pt requires increased time to complete task due to difficulty with fine motor skills in UEs.  Asked whether pt needed to use restroom and/or check brief before ambulating to gym.  Pt states "no," however this therapist encouraged him to go into restroom to make sure brief was clean.  Upon ambulating to restroom at min assist, pt pulled down brief and PT noted brief to be soiled, therefore had pt sit down on toilet in order to change brief and pants in order to don new pants and brief.  Pt exiting restroom and provided cues for washing hands, however pt attempted to leave room without washing hands, therefore provided max verbal cues for washing hands and importance of washing hands before leaving room.  Ambulated to/from therapy gym >150' without AD at min assist level.  Pt continues to demonstrate forward flexed posture, rigid hips, B foot flat, and decreased stride length.  Pt very resistant to any cues provided from therapy.  Challenged balance during gait with continued conversation, asking questions, and conversing while attempting to look for room.  He continues to require max to total assist cues for locating room using signs.  In therapy gym, had pt sit on large physioball to address hip/pelvic motion and trunk control in sitting.  Pt continues to excuse therapy activities stating "I need to prepare for this for when I sit on a ball at home."  Provided max education on importance of improving balance while in therapy for safe D/C home.  While on physioball, had pt perform  slouched posterior pelvic tilt posture and then up into upright posture with anterior pelvic tilt.  Pt requires max to total assist to maintain balance with mac vebral cues for correct positioning of feet and using hips to correct posture.  Ambulated back to room and pt returned to bed.  Pt continues to "joke" about having "8 strokes."  Provided max education on how having this many stokes is very serious and importance of taking meds daily and that his chance for having a stroke doubles once having had one stroke.  Pt verbalized understanding.  Pt with all needs in reach.   Therapy Documentation Precautions:  Precautions Precautions: Fall Precaution Comments: decreased intellectual awareness Restrictions Weight Bearing Restrictions: No   Pain: Pain Assessment Pain Assessment: No/denies pain   Locomotion : Ambulation Ambulation/Gait Assistance: 4: Min assist   See FIM for current functional status  Therapy/Group: Individual Therapy  Denice Bors 03/20/2013, 3:31 PM

## 2013-03-20 NOTE — Progress Notes (Signed)
Physical Therapy Session Note  Patient Details  Name: Alexander Johnston MRN: [id number] Date of Birth: 02-23-1924  Today's Date: 03/20/2013 Time: 8416-6063 Time Calculation (min): 58 min  Short Term Goals: Week 1:  PT Short Term Goal 1 (Week 1): =LTGs due to ELOS  Skilled Therapeutic Interventions/Progress Updates:   Pt received lying in bed this morning, agreeable to therapy.  Note that he had not eaten any breakfast and continues to state that he is "sick in his heart" from not being able to see his wife.  Performed supine to sit at supervision level.  Pt able to don socks and shoes on his own.  Ambulated to sink with min assist in order to comb hair.  Questioned whether pt needed to use restroom or check brief before leaving room, and pt states "no."  Ambulated to therapy gym without AD at min/mod assist today with continued cues for upright posture, increased stride length and heel toe contact during stance phase of gait.  Also note that when attempting to hold conversation with pt while ambulating, pt either loses balance or stops to converse.  Had pt continue with ambulation past therapy gym performing head turns R and L, however pt unable to fully follow commands and very quickly loses balance with any head movement.  While in gym, performed stepping to 6" step x 2 sets of 20 reps for improved balance, activity tolerance, and weight shifting.  Pt requires mod assist to perform activity, and at times requires total assist to prevent fall.  Provided cues for standing upright as he tends to lose balance posteriorly and keeps hip flexed.  Then performed hip strategy activity elevating hips off of wall x 20 reps.  Pt initially unable to complete task, as he demonstrates little to no hip strategy.  He eventually was able to perform at supervision level, however had increased difficulty attaining full upright standing before returning to wall.  Performed standing on balance beam in // bars x 3 mins working  on ankle and hip strategy with and without UE support.  Pt unable to maintain balance for longer than 10-15 secs without UE use due to LOB anteriorly/posteriorly.  Noted pt to have foul smell coming from brief, therefore suggested that we ambulate back to room to use restroom.  Ambulated at mod assist at times back to room.  Attempted to have pt navigate back to room providing max to total assist verbal cues for navigation.  Pt unable to problem solve, divide attention and maintain balance during activity.  Once back in room, used restroom, and noted pt incontinent of bowel (small amount).  This PT states that we need to change brief for new one.  Pt states, "that's not poop, that's just gas that came out."  Educated that there were feces in brief and to prevent skin issues, it would need to be changed.  Pt able to doff/don brief and pants during session and perform peri care at close supervision with use of handrail.  Pt ambulated to sink at min assist then to recliner.  Pt left in recliner with quick release belt on and provided max education to call for assistance before getting up due to very high fall risk.  Pt continues to demonstrate zero awareness of deficits.  Nurse tech notified to check on pt for safety.  All needs left in reach.   Therapy Documentation Precautions:  Precautions Precautions: Fall Precaution Comments: decreased intellectual awareness Restrictions Weight Bearing Restrictions: No   Vital Signs:  Therapy Vitals BP: 153/67 mmHg Pain:Pt with no c/o pain during session.    Locomotion : Ambulation Ambulation/Gait Assistance: 3: Mod assist   See FIM for current functional status  Therapy/Group: Individual Therapy  Denice Bors 03/20/2013, 10:01 AM

## 2013-03-20 NOTE — IPOC Note (Signed)
Overall Plan of Care [facility]) Patient Details Name: ELIE LEPPO MRN: [id number] DOB: 1924-03-30  Admitting Diagnosis: RT CVA  Hospital Problems: Active Problems:   CVA (cerebral infarction)     Functional Problem List: Nursing    PT Endurance;Motor;Safety;Balance  OT Balance;Cognition;Endurance;Motor;Safety  SLP Cognition  TR         Basic ADL's: OT Grooming;Bathing;Dressing;Toileting     Advanced  ADL's: OT Simple Meal Preparation     Transfers: PT Bed Mobility;Car;Bed to Chair;Furniture  OT Toilet;Tub/Shower     Locomotion: PT Ambulation;Wheelchair Mobility;Stairs     Additional Impairments: OT    SLP Social Cognition   Problem Solving;Memory  TR      Anticipated Outcomes Item Anticipated Outcome  Self Feeding independent  Swallowing      Basic self-care  supervision  Toileting  supervision   Bathroom Transfers supervision  Bowel/Bladder     Transfers  supervision  Locomotion  supervision  Communication     Cognition  supervision   Pain     Safety/Judgment      Therapy Plan: PT Intensity: Minimum of 1-2 x/day ,45 to 90 minutes PT Frequency: 5 out of 7 days PT Duration Estimated Length of Stay: 7-10 days OT Intensity: Minimum of 1-2 x/day, 45 to 90 minutes OT Frequency: 5 out of 7 days OT Duration/Estimated Length of Stay: 7-10 days SLP Intensity: Minumum of 1-2 x/day, 30 to 90 minutes SLP Frequency: 5 out of 7 days SLP Duration/Estimated Length of Stay: 7 days       Team Interventions: Nursing Interventions    PT interventions Ambulation/gait training;Balance/vestibular training;Cognitive remediation/compensation;Discharge planning;Disease management/prevention;DME/adaptive equipment instruction;Functional mobility training;Neuromuscular re-education;Patient/family education;Stair training;Therapeutic Activities;Therapeutic Exercise;UE/LE Strength taining/ROM;UE/LE Coordination activities;Visual/perceptual  remediation/compensation;Wheelchair propulsion/positioning  OT Interventions Balance/vestibular training;Cognitive remediation/compensation;Community reintegration;Discharge planning;Functional mobility training;DME/adaptive equipment instruction;Neuromuscular re-education;Pain management;Patient/family education;Psychosocial support;Therapeutic Activities;UE/LE Coordination activities;UE/LE Strength taining/ROM;Self Care/advanced ADL retraining;Therapeutic Exercise  SLP Interventions Cognitive remediation/compensation;Cueing hierarchy;Functional tasks;Internal/external aids;Medication managment;Patient/family education;Therapeutic Activities  TR Interventions    SW/CM Interventions Discharge Planning;Psychosocial Support;Patient/Family Education    Team Discharge Planning: Destination: PT-Assisted Living ,OT- Home (Will need to go to daughters house with 24 hour supervision.) , SLP-  Projected Follow-up: PT-24 hour supervision/assistance;Home health PT (may need ALF), OT-  Home health OT, SLP-  Projected Equipment Needs: PT-Rolling walker with 5" wheels;To be determined, OT- Tub/shower seat;3 in 1 bedside comode, SLP-None recommended by SLP Equipment Details: PT- , OT-  Patient/family involved in discharge planning: PT- Patient,  OT-Patient, SLP-Patient  MD ELOS: 7-10 days Medical Rehab Prognosis:  Good Assessment: 80 y.o. male with history of CAD, IBS, CVA, who was admitted on 03/15/13 with left sided numbness, fall and difficulty speaking. Patient reported to have similar symptoms few days before that resolved on its own. MRI and MRA brain multiple small areas of acute infarct affecting right cerebellum right medulla, superior  cerebellar vermis, right temporal lobe, right frontal and parietal lobe, right occipital lobe, right splenium of the corpus callosum as well as left frontal and parietal lobe. 2D echo with EF 50-09%, grade 1 diastolic dysfunction and no wall abnormality. Carotid dopplers  without significant stenosis. Neurology consulted and recommended changing ASA to 325 mg/daily as well as TEE  TEE aortic arch and descending aorta plaque, no PFO  Now requiring 24/7 Rehab RN,MD, as well as CIR level PT, OT and SLP.  Treatment team will focus on ADLs and mobility with goals set at [place]     See Team Conference Notes for weekly updates to the plan of care

## 2013-03-20 NOTE — Progress Notes (Signed)
Social Work Elease Hashimoto, LCSW Social Worker Signed  Patient Care Conference Service date: 03/19/2013 3:07 PM  Inpatient RehabilitationTeam Conference and Plan of Care Update Date: 03/19/2013   Time: 11;30 AM     Patient Name: Alexander Johnston       Medical Record Number: [id number]   Date of Birth: 1924-09-01 Sex: Male         Room/Bed: 4W24C/4W24C-01 Payor Info: Payor: MEDICARE / Plan: MEDICARE PART A AND B / Product Type: *No Product type* /   Admitting Diagnosis: RT CVA   Admit Date/Time:  03/18/2013  6:21 PM Admission Comments: No comment available   Primary Diagnosis:  <principal problem not specified> Principal Problem: <principal problem not specified>    Patient Active Problem List     Diagnosis  Date Noted   .  CVA (cerebral infarction)  03/15/2013   .  Diarrhea     .  Rectal cancer  10/21/2012   .  Colon cancer  10/17/2012   .  HTN (hypertension)  10/15/2012   .  Other and unspecified hyperlipidemia  10/15/2012   .  Heart disease, unspecified  10/15/2012     Expected Discharge Date: Expected Discharge Date: 03/25/13  Team Members Present: Physician leading conference: Dr. Alysia Penna Social Worker Present: Alfonse Alpers, LCSW;Becky Daniyla Pfahler, LCSW Nurse Present: Other (comment) Joelene Millin Cook-RN) PT Present: Cameron Sprang, Cecille Rubin, PT OT Present: Clyda Greener, Lorelee Cover, OT SLP Present: Gunnar Fusi, SLP PPS Coordinator present : Daiva Nakayama, RN, CRRN        Current Status/Progress  Goal  Weekly Team Focus   Medical     poor awareness of deficits, decreased balance requiring Min A  Sup/Mod I  Improve awareness of deficits   Bowel/Bladder     Continent of bowel and bladder; LBM 03/17/13  Continent of bowel and bladder.  Continue to offer toileting and urinal PRN   Swallow/Nutrition/ Hydration     WFL-Regular diet       ADL's     Pt currenlty performs transfer with min assist stand pivot to the 3:1, and also needs min assist for standing at the  sink with grooming tasks and for bathing and dressing.  Demonstrates decreased awareness of his balance deficits as well..  supervision level for all selfcare tasks  selfcare re-traiining, balance retraining, cognitive awareness and safety education   Mobility     eval pending       Communication     WFL       Safety/Cognition/ Behavioral Observations    Min assist complex problem solving   Supervision   increase pocessing speed, organization and sequencing   Pain     Pain related to blister/skin tear on right lower leg, managed with tylenol  </=3  Offer pain medicine PRN, keep wound covered and protected   Skin     Skin tear and serous filled blister to right shin  No new skin breakdown  Keep wound clean and protected     *See Care Plan and progress notes for long and short-term goals.    Barriers to Discharge:  wife in memory unit      Possible Resolutions to Barriers:    Needs int sup      Discharge Planning/Teaching Needs:    Assisting Living facility upon discharge if can get pt to agree.  Daughter is on-board, but pt is not at this time      Team Discussion:    Pt has balance  and lacks awareness, recommendation is 24 hr supervision level upon discharge.  Cont B & B, some urgency issues   Revisions to Treatment Plan:    New Eval    Continued Need for Acute Rehabilitation Level of Care: The patient requires daily medical management by a physician with specialized training in physical medicine and rehabilitation for the following conditions: Daily direction of a multidisciplinary physical rehabilitation program to ensure safe treatment while eliciting the highest outcome that is of practical value to the patient.: Yes Daily medical management of patient stability for increased activity during participation in an intensive rehabilitation regime.: Yes Daily analysis of laboratory values and/or radiology reports with any subsequent need for medication adjustment of medical  intervention for : Neurological problems  Elease Hashimoto 03/20/2013, 8:55 AM          Patient ID: Alexander Johnston, male   DOB: [birth year], 80 y.o.   MRN: [id number]

## 2013-03-21 ENCOUNTER — Encounter (HOSPITAL_COMMUNITY): Payer: Medicare Other | Admitting: Occupational Therapy

## 2013-03-21 ENCOUNTER — Inpatient Hospital Stay (HOSPITAL_COMMUNITY): Payer: Medicare Other | Admitting: Speech Pathology

## 2013-03-21 ENCOUNTER — Inpatient Hospital Stay (HOSPITAL_COMMUNITY): Payer: Medicare Other | Admitting: Occupational Therapy

## 2013-03-21 ENCOUNTER — Inpatient Hospital Stay (HOSPITAL_COMMUNITY): Payer: Medicare Other | Admitting: Rehabilitation

## 2013-03-21 DIAGNOSIS — I6350 Cerebral infarction due to unspecified occlusion or stenosis of unspecified cerebral artery: Secondary | ICD-10-CM

## 2013-03-21 NOTE — Progress Notes (Signed)
Subjective/Complaints: 80 y.o. male with history of CAD, IBS, CVA, who was admitted on 03/15/13 with left sided numbness, fall and difficulty speaking. Patient reported to have similar symptoms few days before that resolved on its own. MRI and MRA brain multiple small areas of acute infarct affecting right cerebellum right medulla, superior  cerebellar vermis, right temporal lobe, right frontal and parietal lobe, right occipital lobe, right splenium of the corpus callosum as well as left frontal and parietal lobe. 2D echo with EF 75-91%, grade 1 diastolic dysfunction and no wall abnormality. Carotid dopplers without significant stenosis. Neurology consulted and recommended changing ASA to 325 mg/daily as well as TEE   TEE aortic arch and descending aorta plaque, no PFO  A little anxious still, mostly concerned about his wife who has dementia. Review of Systems - Negative except poor sleep and constipation  Objective: Vital Signs: Blood pressure 119/77, pulse 58, temperature 98 F (36.7 C), temperature source Oral, resp. rate 18, height 6' 2"  (1.88 m), weight 86.3 kg (190 lb 4.1 oz), SpO2 99.00%. No results found. Results for orders placed during the hospital encounter of 03/18/13 (from the past 72 hour(s))  CBC WITH DIFFERENTIAL     Status: Abnormal   Collection Time    03/19/13  5:25 AM      Result Value Range   WBC 6.7  4.0 - 10.5 K/uL   RBC 4.21 (*) 4.22 - 5.81 MIL/uL   Hemoglobin 12.4 (*) 13.0 - 17.0 g/dL   HCT 37.3 (*) 39.0 - 52.0 %   MCV 88.6  78.0 - 100.0 fL   MCH 29.5  26.0 - 34.0 pg   MCHC 33.2  30.0 - 36.0 g/dL   RDW 16.4 (*) 11.5 - 15.5 %   Platelets 270  150 - 400 K/uL   Neutrophils Relative % 60  43 - 77 %   Neutro Abs 4.0  1.7 - 7.7 K/uL   Lymphocytes Relative 15  12 - 46 %   Lymphs Abs 1.0  0.7 - 4.0 K/uL   Monocytes Relative 13 (*) 3 - 12 %   Monocytes Absolute 0.9  0.1 - 1.0 K/uL   Eosinophils Relative 11 (*) 0 - 5 %   Eosinophils Absolute 0.8 (*) 0.0 - 0.7 K/uL   Basophils Relative 1  0 - 1 %   Basophils Absolute 0.1  0.0 - 0.1 K/uL  COMPREHENSIVE METABOLIC PANEL     Status: Abnormal   Collection Time    03/19/13  5:25 AM      Result Value Range   Sodium 142  137 - 147 mEq/L   Potassium 4.4  3.7 - 5.3 mEq/L   Chloride 106  96 - 112 mEq/L   CO2 24  19 - 32 mEq/L   Glucose, Bld 97  70 - 99 mg/dL   BUN 23  6 - 23 mg/dL   Creatinine, Ser 0.86  0.50 - 1.35 mg/dL   Calcium 8.8  8.4 - 10.5 mg/dL   Total Protein 5.9 (*) 6.0 - 8.3 g/dL   Albumin 2.9 (*) 3.5 - 5.2 g/dL   AST 21  0 - 37 U/L   ALT 14  0 - 53 U/L   Alkaline Phosphatase 64  39 - 117 U/L   Total Bilirubin 0.2 (*) 0.3 - 1.2 mg/dL   GFR calc non Af Amer 76 (*) >90 mL/min   GFR calc Af Amer 88 (*) >90 mL/min   Comment: (NOTE)     The eGFR  has been calculated using the CKD EPI equation.     This calculation has not been validated in all clinical situations.     eGFR's persistently <90 mL/min signify possible Chronic Kidney     Disease.      General: No acute distress Mood and affect are appropriate Heart: Regular rate and rhythm no rubs murmurs or extra sounds Lungs: Clear to auscultation, breathing unlabored, no rales or wheezes, fine crackles at bases Abdomen: Positive bowel sounds, soft nontender to palpation, nondistended Extremities: No clubbing, cyanosis, or edema Skin: No evidence of breakdown, no evidence of rash Neurologic: Cranial nerves II through XII intact, motor strength is 5/5 in bilateral deltoid, bicep, tricep, grip, hip flexor, knee extensors, ankle dorsiflexor and plantar flexor Sensory exam normal sensation to light touch and proprioception in bilateral upper and reduce LT lower extremities Cerebellar exam normal finger to nose to finger mild dysmetria heel to shin in bilateral upper and lower extremities Musculoskeletal: Full range of motion in all 4 extremities. No joint swelling Psych: mood anxious but generally pleasant and cooperative.  Assessment/Plan: 1.  Functional deficits secondary to embolic multiple small areas of acute infarct affecting right cerebellum right medulla, superior  cerebellar vermis, right temporal lobe, right frontal and parietal lobe, right occipital lobe, right splenium of the corpus callosum as well as left frontal and parietal lobe.  which require 3+ hours per day of interdisciplinary therapy in a comprehensive inpatient rehab setting.  We duiscussed pt had a CVA in 9 areas of the brain which have caused not only balance issues but poor awareness of deficits  Also discussed need for assistance post CVA and no driving FIM: FIM - Bathing Bathing Steps Patient Completed: Chest;Left upper leg;Right lower leg (including foot);Right Arm;Left Arm;Left lower leg (including foot);Abdomen;Buttocks;Front perineal area;Right upper leg Bathing: 4: Steadying assist  FIM - Upper Body Dressing/Undressing Upper body dressing/undressing steps patient completed: Thread/unthread right sleeve of pullover shirt/dresss;Thread/unthread left sleeve of pullover shirt/dress;Put head through opening of pull over shirt/dress;Pull shirt over trunk;Thread/unthread right sleeve of front closure shirt/dress;Thread/unthread left sleeve of front closure shirt/dress;Button/unbutton shirt;Pull shirt around back of front closure shirt/dress Upper body dressing/undressing: 5: Set-up assist to: Obtain clothing/put away FIM - Lower Body Dressing/Undressing Lower body dressing/undressing steps patient completed: Thread/unthread right underwear leg;Thread/unthread left underwear leg;Pull underwear up/down;Thread/unthread right pants leg;Thread/unthread left pants leg;Don/Doff right shoe;Fasten/unfasten left shoe;Fasten/unfasten right shoe;Fasten/unfasten pants;Don/Doff left shoe;Pull pants up/down Lower body dressing/undressing: 4: Steadying Assist  FIM - Toileting Toileting steps completed by patient: Adjust clothing prior to toileting;Performs perineal  hygiene;Adjust clothing after toileting Toileting: 4: Steadying assist  FIM - Radio producer Devices: Grab bars;Elevated toilet seat Toilet Transfers: 4-To toilet/BSC: Min A (steadying Pt. > 75%);4-From toilet/BSC: Min A (steadying Pt. > 75%)  FIM - Bed/Chair Transfer Bed/Chair Transfer Assistive Devices: Arm rests Bed/Chair Transfer: 5: Supine > Sit: Supervision (verbal cues/safety issues)  FIM - Locomotion: Wheelchair Distance: 155 Locomotion: Wheelchair: 0: Activity did not occur FIM - Locomotion: Ambulation Locomotion: Ambulation Assistive Devices:  (none) Ambulation/Gait Assistance: 4: Min assist Locomotion: Ambulation: 4: Travels 150 ft or more with minimal assistance (Pt.>75%)  Comprehension Comprehension Mode: Auditory Comprehension: 5-Understands basic 90% of the time/requires cueing < 10% of the time  Expression Expression Mode: Verbal Expression: 5-Expresses basic needs/ideas: With no assist  Social Interaction Social Interaction: 5-Interacts appropriately 90% of the time - Needs monitoring or encouragement for participation or interaction.  Problem Solving Problem Solving: 5-Solves basic 90% of the time/requires cueing < 10%  of the time  Memory Memory: 3-Recognizes or recalls 50 - 74% of the time/requires cueing 25 - 49% of the time  Medical Problem List and Plan:  1. DVT Prophylaxis/Anticoagulation: Pharmaceutical: Lovenox  2. Pain Management: prn tylenol.  3. Mood: Improvement in anxiety levels as a whole  -team to continue providing ego support 4. Neuropsych: This patient is not capable of making decisions on his own behalf.  5. Multiple abrasions: right lower leg, left shin and well as Right 1st and 2nd toes.   -continue local care 6. Colon cancer: History of intermittent diarrhea.  7. HTN: Will monitor with bid checks. Continue Cardizem, Norvasc and Losartan.  8. Dyslipidemia: continue Crestor.    LOS (Days) 3 A FACE TO  FACE EVALUATION WAS PERFORMED  Shane Badeaux T 03/21/2013, 10:01 AM

## 2013-03-21 NOTE — Progress Notes (Signed)
Speech Language Pathology Daily Session Note  Patient Details  Name: JADARIAN MCKAY MRN: [id number] Date of Birth: 1924-11-14  Today's Date: 03/21/2013 Time: 1003-1100 Time Calculation (min): 57 min  Short Term Goals: Week 1: SLP Short Term Goal 1 (Week 1): Patient will complete complex problem solving tasks with supervision verbal cues. SLP Short Term Goal 2 (Week 1): Patient will verbilize two modifications he will make to increase safety upon discharge.   Skilled Therapeutic Interventions: Skilled treatment session focused on addressing cognitive goals. Patient required Max cues to locate previously created medication list.  Patient had written changes on list, which were how he had been managing medications at home and required Max assist for mental flexibility to follow new regimen due to CVAs and need for medication changes.  Patient also required Max assist to organize, sequence, problem solve, self-monitor and correct errors while loading medication box.  Patient informed SLP that he needed to go to the bathroom after he had already gone.  Patient returned to room and SLP provided Max verbal cues to organize, sequence and problem solve self-care task.  Continue with current plan of care.   FIM:  Comprehension Comprehension Mode: Auditory Comprehension: 4-Understands basic 75 - 89% of the time/requires cueing 10 - 24% of the time Expression Expression Mode: Verbal Expression: 5-Expresses basic needs/ideas: With no assist Social Interaction Social Interaction: 5-Interacts appropriately 90% of the time - Needs monitoring or encouragement for participation or interaction. Problem Solving Problem Solving: 2-Solves basic 25 - 49% of the time - needs direction more than half the time to initiate, plan or complete simple activities Memory Memory: 3-Recognizes or recalls 50 - 74% of the time/requires cueing 25 - 49% of the time  Pain Pain Assessment Pain Assessment: No/denies  pain  Therapy/Group: Individual Therapy  Carmelia Roller., CCC-SLP [phone]  [place] 03/21/2013, 12:32 PM

## 2013-03-21 NOTE — Progress Notes (Signed)
Occupational Therapy Session Note  Patient Details  Name: Alexander Johnston MRN: [id number] Date of Birth: [birth year]  Today's Date: 03/21/2013 Time: 1003-1100 Time Calculation (min): 57 min  Short Term Goals: Week 1:  OT Short Term Goal 1 (Week 1): Goals set at supervision level for discharge.  No STGs set based on LOS.  Skilled Therapeutic Interventions/Progress Updates:    Pt performed sponge bathe at the sink this session.  Pt needed min assist for transfer from bedside chair to wheelchair in front of the sink.  He was able to perform all bathing but needed min guard assist for standing balance.  On one occasion pt demonstrated LOB posteriorly but was able to recognize it and grab hold of the sink to correct it.  Pt also able to perform dressing with min guard assist as well.  Noted pt with scab removed on his right 2nd toe with slight bleeding.  Therapist notified nursing but nurse unavailable so wrapped toe in gauze and tape to keep from bleeding.  Pt left in bedside chair with safety belt in place at end of session.   Therapy Documentation Precautions:  Precautions Precautions: Fall Precaution Comments: decreased intellectual awareness Restrictions Weight Bearing Restrictions: No  Pain: Pain Assessment Pain Assessment: No/denies pain ADL: See FIM for current functional status  Therapy/Group: Individual Therapy  Brayden Brodhead OTR/L 03/21/2013, 12:32 PM

## 2013-03-21 NOTE — Progress Notes (Signed)
Physical Therapy Session Note  Patient Details  Name: Alexander Johnston MRN: [id number] Date of Birth: 27-Sep-1924  Today's Date: 03/21/2013 Time: 6256-3893 Time Calculation (min): 48 min  Short Term Goals: Week 1:  PT Short Term Goal 1 (Week 1): =LTGs due to ELOS  Skilled Therapeutic Interventions/Progress Updates:   Pt received lying in bed this morning, agreeable to therapy.  Pt unable to recall therapist's name this morning, therefore provided total assist cues for name and occupation.  Performed supine to sit at supervision.  Once at [facility], asked whether pt needed to use the restroom.  He stated that he felt like he needed to urinate.  Ambulated into restroom with min assist.  Pt continues to grab onto any piece of furniture/object that he can to balance himself.  Pt stood to urinate and upon standing, had pt check whether he had soiled brief.  Note that brief was soiled, therefore had pt turn to sit on toilet and attempt to have any more bowel movement and change brief for new one and don pants.  Pt continues to require max education on hygiene and importance of clean brief.  Assisted pt to sink to wash hands.  Pt requires max cues for what he was supposed to do at sink this morning.  Ambulated >150' to/from gym at min assist while carrying on conversation with therapist.  Note pt with one overt LOB into wall.  States that he lost his balance because he was talking.  Once in gym, had pt ambulate 25' x 2 reps around and over obstacles.  Requires mod assist to prevent fall with max verbal cues for correct technique.  Also performed ambulation on mat surface while tapping to cones.  Pt with continuous overt LOB.  Pt did acknowledge that he kept losing balance and that task was very difficult to perform, however could not relate how mat surface compares to outdoors surface.  Ambulated back to room with pt navigating back.  He went all the way to midwest section of hallway, even though he was stating  "[phone]."  Pt finally able to navigate to room with max assist verbal cues.  Pt left in w/c in room with SLP present for next session.    Therapy Documentation Precautions:  Precautions Precautions: Fall Precaution Comments: decreased intellectual awareness Restrictions Weight Bearing Restrictions: No   Vital Signs: Therapy Vitals BP: 119/77 mmHg Pain: no pain this morning.    Locomotion : Ambulation Ambulation/Gait Assistance: 4: Min assist   See FIM for current functional status  Therapy/Group: Individual Therapy  Denice Bors 03/21/2013, 9:54 AM

## 2013-03-21 NOTE — Progress Notes (Signed)
Occupational Therapy Session Note  Patient Details  Name: Alexander Johnston MRN: [id number] Date of Birth: 03/20/1924  Today's Date: 03/21/2013 Time: 5638-7564 Time Calculation (min): 45 min  Skilled Therapeutic Interventions/Progress Updates:    Pt walked down to they gym with min assist.  Maintains lumbar flexion with mobility and occasional weaving with LOB, especially turns.  In the therapy gym pt worked in standing using the rebounder to toss and catch a basketball.  Pt with slight decreased balance needing min assist to self correct.  Progressed to standing on foam surface and attempting to toss the ball to the rebounder, however pt needing max assist to maintain balance with frequent LOB in all directions.  Pt transitioned to standing on flat surface and attempted to perform forward weightshifts to transition his weight from his heels to his toes.  Pt with increased fear of falling forward so he is not able to perform adequate ankle strategies to keep him from falling backwards.  Also had pt pick up small cones from the floor with min assist.      Therapy Documentation Precautions:  Precautions Precautions: Fall Precaution Comments: decreased intellectual awareness Restrictions Weight Bearing Restrictions: No  Pain: Pain Assessment Pain Assessment: No/denies pain ADL: See FIM for current functional status  Therapy/Group: Individual Therapy  Kamie Korber OTR/L 03/21/2013, 3:30 PM

## 2013-03-22 ENCOUNTER — Inpatient Hospital Stay (HOSPITAL_COMMUNITY): Payer: Medicare Other | Admitting: Speech Pathology

## 2013-03-22 ENCOUNTER — Inpatient Hospital Stay (HOSPITAL_COMMUNITY): Payer: Medicare Other

## 2013-03-22 ENCOUNTER — Inpatient Hospital Stay (HOSPITAL_COMMUNITY): Payer: Medicare Other | Admitting: *Deleted

## 2013-03-22 DIAGNOSIS — I10 Essential (primary) hypertension: Secondary | ICD-10-CM

## 2013-03-22 DIAGNOSIS — I519 Heart disease, unspecified: Secondary | ICD-10-CM

## 2013-03-22 DIAGNOSIS — E785 Hyperlipidemia, unspecified: Secondary | ICD-10-CM

## 2013-03-22 DIAGNOSIS — I6350 Cerebral infarction due to unspecified occlusion or stenosis of unspecified cerebral artery: Secondary | ICD-10-CM

## 2013-03-22 NOTE — Progress Notes (Signed)
Speech Language Pathology Daily Session Note  Patient Details  Name: Alexander Johnston MRN: [id number] Date of Birth: 25-Jun-1924  Today's Date: 03/22/2013 Time: 4287-6811 Time Calculation (min): 45 min  Short Term Goals: Week 1: SLP Short Term Goal 1 (Week 1): Patient will complete complex problem solving tasks with supervision verbal cues. SLP Short Term Goal 2 (Week 1): Patient will verbilize two modifications he will make to increase safety upon discharge.   Skilled Therapeutic Interventions: Skilled intervention complete with short term goals addressed.  The patient moderate verbal cues to complete activities that required mental flexibility and to stay on topic during conversation, in a semi-controlled environment.  He required min A verbal cues to discuss adaptations that may be required upon discharge, which made him feel sad and somewhat tearful.  Continue with current treatment plan.    FIM:  Comprehension Comprehension Mode: Auditory Comprehension: 4-Understands basic 75 - 89% of the time/requires cueing 10 - 24% of the time Expression Expression Mode: Verbal Expression: 5-Expresses basic needs/ideas: With no assist Problem Solving Problem Solving: 2-Solves basic 25 - 49% of the time - needs direction more than half the time to initiate, plan or complete simple activities  Pain Pain Assessment Pain Assessment: No/denies pain  Therapy/Group: Individual Therapy  Frances Maywood 03/22/2013, 3:06 PM

## 2013-03-22 NOTE — Progress Notes (Signed)
Occupational Therapy Note  Patient Details  Name: Alexander Johnston MRN: [id number] Date of Birth: [birth year] Today's Date: 03/22/2013  Time:  0900-0940   (40 min) Pain: none  Individual session  Engaged in bathing and dressing at sink level.  Performed supine to sit from bed with min verbal cues and min assist.  Transferred to wc with min assist.  Pt sat at sink and performed bathing and dressing.  See FIM.  Left in room with speech therapist for next session  .   Lisa Roca 03/22/2013, 9:43 AM

## 2013-03-22 NOTE — Progress Notes (Signed)
Physical Therapy Session Note  Patient Details  Name: Alexander Johnston MRN: [id number] Date of Birth: [birth year]  Today's Date: 03/22/2013 Time: 1100-1215 and 1315-1400 Time Calculation (min): 75 min and 45 min  Short Term Goals: Week 1:  PT Short Term Goal 1 (Week 1): =LTGs due to ELOS  Skilled Therapeutic Interventions/Progress Updates:  Treatment 1: Pt asleep in left side lying upon PT entering room. Pt able to arouse pt with tactile stimuli. Pt agreeable to therapy. Pt began ambulation without assistive device, requiring min A for steadying and verbal cues to remain aware of obstacles in his path and cues to promote equal stride length and upright posture, as well as decrease in gait speed. Pt consistently talking to people in hallway and in gym, easily distracted by surroundings, requiring cues to redirect. Pt ambulated a total of 175 feet without AD and min A. Pt then ambulated 250 feet with SPC and min A with cues for safety awareness and gait speed. Pt instructed in negotiation of apartment, transferring in/out of bed at supervision and on/of sofa at supervision with use of arm rests. Pt returned to gym, again distracted by other patients family members in the gym, introducing himself, PT providing increased assist (mod A) so pt does not have LOB as pt reaches out to shake family members hand. Pt completed balance/proprioception activity in hallway, searching and reaching for cones on railing and on the floor. Pt given cues to scan for cones to the right and left. Pt required min/mod A to stoop to floor to pick up x3 cones. Pt stated "if I do this fast I will be the best." PT instructed patient in continued safety awareness. Pt negotiated up/down stairs and up/over x1 curb continuously for sequencing and problem solving task. Pt able to negotiate up/down x15 steps using a left rail and cane in the right hand with min A and x3 curbs with min A and  Cues for attention to task and safety awareness. Pt  ambulated in hallways towards room, requiring cues for direction. Pt able to recall room number and general direction of room, but required cues to read signs to find his room. Pt request to use the bathroom prior to returning to supine. Pt stood to urinate, requiring min A for steadying. Pt able to pull pants up/down independently. Pt supine in bed with bed exit on and end of session.    Treatment 2: Pt asleep again in his room, requiring tactile stimuli to arouse. Patients lunch untouched, reporting "I have no reason to eat, I am heartbroken." Pt agreeable to therapy, requesting to use the bathroom prior to going in the hallway. Pt ambulated to  Bathroom, standing to urinate, requiring min A for steadying. Pt ambulated in hallway 150 feet x1 with min A and cues for safety and decrease in cadence. Pt completed  There ex in sitting, including toe raises, heel raises, LAQ, marching, hip abd, ball squeezes/hip add and hamstring curls with 2# on BLE and green thera band for hip abd and hamstring curl. Pt instructed in balance and proprioceptive activity including seated ball toss (x20) and bouncing (x20). Pt also completed bouncing of ball in standing  (15 x3) with patient standing in front of mat table. Pt had x1 near LOB with pt able to self  correct by stepping fwd (protective response intact). Pt ambulated back to room with SPC . Pt request to use the bathroom again, standing to urinate, with min A for steadying. Pt request to  sit EOB to make some phone calls. Bed alarm on and nsg notified.  Therapy Documentation Precautions:  Precautions Precautions: Fall Precaution Comments: decreased intellectual awareness Restrictions Weight Bearing Restrictions: No Vital Signs: Therapy Vitals Temp: 97.7 F (36.5 C) Temp src: Oral Pulse Rate: 71 Resp: 18 BP: 133/81 mmHg Patient Position, if appropriate: Sitting Oxygen Therapy SpO2: 97 % O2 Device: None (Room air) Pain: Pain Assessment Pain Assessment:  No/denies pain      See FIM for current functional status  Therapy/Group: Individual Therapy  Carsten Carstarphen R 03/22/2013, 3:40 PM

## 2013-03-22 NOTE — Progress Notes (Signed)
Alexander Johnston is a 79 y.o. male Jun 23, 1924 [id number]  Subjective: No new complaints. No new problems. Slept well. Feeling OK.  Objective: Vital signs in last 24 hours: Temp:  [97.4 F (36.3 C)] 97.4 F (36.3 C) (02/07 0609) Pulse Rate:  [55-60] 60 (02/07 0609) Resp:  [17-18] 17 (02/07 0609) BP: (105-130)/(54-67) 129/67 mmHg (02/07 0810) SpO2:  [96 %-97 %] 96 % (02/07 0609) Weight change:  Last BM Date: 03/21/13  Intake/Output from previous day: 02/06 0701 - 02/07 0700 In: 840 [P.O.:840] Out: 100 [Urine:100] Last cbgs: CBG (last 3)  No results found for this basename: GLUCAP,  in the last 72 hours   Physical Exam General: No apparent distress   Lungs: Normal effort. Lungs clear to auscultation, no crackles or wheezes. Cardiovascular: Regular rate and rhythm, no edema Abdomen: S/NT/ND; BS(+) Musculoskeletal:  unchanged Neurological: No new neurological deficits Wounds: N/A    Skin: clear Mental state: A/C    Lab Results: BMET    Component Value Date/Time   NA 142 03/19/2013 0525   NA 137 11/19/2012 1041   K 4.4 03/19/2013 0525   K 4.2 11/19/2012 1041   CL 106 03/19/2013 0525   CO2 24 03/19/2013 0525   CO2 25 11/19/2012 1041   GLUCOSE 97 03/19/2013 0525   GLUCOSE 103 11/19/2012 1041   BUN 23 03/19/2013 0525   BUN 20.0 11/19/2012 1041   CREATININE 0.86 03/19/2013 0525   CREATININE 0.8 11/19/2012 1041   CALCIUM 8.8 03/19/2013 0525   CALCIUM 8.8 11/19/2012 1041   GFRNONAA 76* 03/19/2013 0525   GFRAA 88* 03/19/2013 0525   CBC    Component Value Date/Time   WBC 6.7 03/19/2013 0525   WBC 7.5 02/25/2013 1008   RBC 4.21* 03/19/2013 0525   RBC 4.22 02/25/2013 1008   HGB 12.4* 03/19/2013 0525   HGB 12.3* 02/25/2013 1008   HCT 37.3* 03/19/2013 0525   HCT 37.7* 02/25/2013 1008   PLT 270 03/19/2013 0525   PLT 264 02/25/2013 1008   MCV 88.6 03/19/2013 0525   MCV 89.1 02/25/2013 1008   MCH 29.5 03/19/2013 0525   MCH 29.0 02/25/2013 1008   MCHC 33.2 03/19/2013 0525   MCHC 32.6 02/25/2013 1008   RDW  16.4* 03/19/2013 0525   RDW 20.2* 02/25/2013 1008   LYMPHSABS 1.0 03/19/2013 0525   LYMPHSABS 0.9 02/25/2013 1008   MONOABS 0.9 03/19/2013 0525   MONOABS 0.8 02/25/2013 1008   EOSABS 0.8* 03/19/2013 0525   EOSABS 1.1* 02/25/2013 1008   BASOSABS 0.1 03/19/2013 0525   BASOSABS 0.1 02/25/2013 1008    Studies/Results: No results found.  Medications: I have reviewed the patient's current medications.  Assessment/Plan:   1. DVT Prophylaxis/Anticoagulation: Pharmaceutical: Lovenox  2. Pain Management: prn tylenol.  3. Mood: Improvement in anxiety levels as a whole  -team to continue providing ego support  4. Neuropsych: This patient is not capable of making decisions on his own behalf.  5. Multiple abrasions: right lower leg, left shin and well as Right 1st and 2nd toes.  -continue local care  6. Colon cancer: History of intermittent diarrhea.  7. HTN: Will monitor with bid checks. Continue Cardizem, Norvasc and Losartan.  8. Dyslipidemia: continue Crestor.   Cont Rx    Length of stay, days: [place] , MD 03/22/2013, 9:56 AM

## 2013-03-23 MED ORDER — NAPROXEN 250 MG PO TABS
250.0000 mg | ORAL_TABLET | Freq: Every day | ORAL | Status: DC | PRN
  Administered 2013-03-23 – 2013-03-24 (×2): 250 mg via ORAL
  Filled 2013-03-23 (×2): qty 1

## 2013-03-23 NOTE — Progress Notes (Signed)
Alexander Johnston is a 80 y.o. male 12/02/1924 [id number]  Subjective: C/o a noisy neighbor. No new problems. Slept well. Feeling OK.  Objective: Vital signs in last 24 hours: Temp:  [97.7 F (36.5 C)-98 F (36.7 C)] 98 F (36.7 C) (02/08 0559) Pulse Rate:  [71-72] 72 (02/08 0559) Resp:  [18-20] 20 (02/08 0559) BP: (129-133)/(60-81) 131/60 mmHg (02/08 0841) SpO2:  [97 %-99 %] 99 % (02/08 0559) Weight change:  Last BM Date: 03/21/13  Intake/Output from previous day: 02/07 0701 - 02/08 0700 In: 720 [P.O.:720] Out: 750 [Urine:750] Last cbgs: CBG (last 3)  No results found for this basename: GLUCAP,  in the last 72 hours   Physical Exam General: No apparent distress   Lungs: Normal effort. Lungs clear to auscultation, no crackles or wheezes. Cardiovascular: Regular rate and rhythm, no edema Abdomen: S/NT/ND; BS(+) Musculoskeletal:  unchanged Neurological: No new neurological deficits Wounds: N/A    Skin: clear Mental state: A/o/c    Lab Results: BMET    Component Value Date/Time   NA 142 03/19/2013 0525   NA 137 11/19/2012 1041   K 4.4 03/19/2013 0525   K 4.2 11/19/2012 1041   CL 106 03/19/2013 0525   CO2 24 03/19/2013 0525   CO2 25 11/19/2012 1041   GLUCOSE 97 03/19/2013 0525   GLUCOSE 103 11/19/2012 1041   BUN 23 03/19/2013 0525   BUN 20.0 11/19/2012 1041   CREATININE 0.86 03/19/2013 0525   CREATININE 0.8 11/19/2012 1041   CALCIUM 8.8 03/19/2013 0525   CALCIUM 8.8 11/19/2012 1041   GFRNONAA 76* 03/19/2013 0525   GFRAA 88* 03/19/2013 0525   CBC    Component Value Date/Time   WBC 6.7 03/19/2013 0525   WBC 7.5 02/25/2013 1008   RBC 4.21* 03/19/2013 0525   RBC 4.22 02/25/2013 1008   HGB 12.4* 03/19/2013 0525   HGB 12.3* 02/25/2013 1008   HCT 37.3* 03/19/2013 0525   HCT 37.7* 02/25/2013 1008   PLT 270 03/19/2013 0525   PLT 264 02/25/2013 1008   MCV 88.6 03/19/2013 0525   MCV 89.1 02/25/2013 1008   MCH 29.5 03/19/2013 0525   MCH 29.0 02/25/2013 1008   MCHC 33.2 03/19/2013 0525   MCHC 32.6  02/25/2013 1008   RDW 16.4* 03/19/2013 0525   RDW 20.2* 02/25/2013 1008   LYMPHSABS 1.0 03/19/2013 0525   LYMPHSABS 0.9 02/25/2013 1008   MONOABS 0.9 03/19/2013 0525   MONOABS 0.8 02/25/2013 1008   EOSABS 0.8* 03/19/2013 0525   EOSABS 1.1* 02/25/2013 1008   BASOSABS 0.1 03/19/2013 0525   BASOSABS 0.1 02/25/2013 1008    Studies/Results: No results found.  Medications: I have reviewed the patient's current medications.  Assessment/Plan:   1. DVT Prophylaxis/Anticoagulation: Pharmaceutical: Lovenox  2. Pain Management: prn tylenol.  3. Mood: Improvement in anxiety levels as a whole  -team to continue providing ego support  4. Neuropsych: This patient is not capable of making decisions on his own behalf.  5. Multiple abrasions: right lower leg, left shin and well as Right 1st and 2nd toes.  -continue local care  6. Colon cancer: History of intermittent diarrhea.  7. HTN: Will monitor with bid checks. Continue Cardizem, Norvasc and Losartan.  8. Dyslipidemia: continue Crestor.   Cont Rx    Length of stay, days: 5  Walker Kehr , MD 03/23/2013, 8:59 AM

## 2013-03-24 ENCOUNTER — Encounter (HOSPITAL_COMMUNITY): Payer: Medicare Other

## 2013-03-24 ENCOUNTER — Inpatient Hospital Stay (HOSPITAL_COMMUNITY): Payer: Medicare Other | Admitting: Rehabilitation

## 2013-03-24 ENCOUNTER — Encounter (HOSPITAL_COMMUNITY): Payer: Medicare Other | Admitting: Occupational Therapy

## 2013-03-24 ENCOUNTER — Inpatient Hospital Stay (HOSPITAL_COMMUNITY): Payer: Medicare Other | Admitting: Speech Pathology

## 2013-03-24 ENCOUNTER — Encounter (INDEPENDENT_AMBULATORY_CARE_PROVIDER_SITE_OTHER): Payer: Self-pay | Admitting: General Surgery

## 2013-03-24 ENCOUNTER — Inpatient Hospital Stay (HOSPITAL_COMMUNITY): Payer: Medicare Other | Admitting: Occupational Therapy

## 2013-03-24 DIAGNOSIS — I69993 Ataxia following unspecified cerebrovascular disease: Secondary | ICD-10-CM

## 2013-03-24 DIAGNOSIS — I6330 Cerebral infarction due to thrombosis of unspecified cerebral artery: Secondary | ICD-10-CM

## 2013-03-24 DIAGNOSIS — F329 Major depressive disorder, single episode, unspecified: Secondary | ICD-10-CM | POA: Diagnosis present

## 2013-03-24 DIAGNOSIS — F3289 Other specified depressive episodes: Secondary | ICD-10-CM | POA: Diagnosis present

## 2013-03-24 MED ORDER — SERTRALINE HCL 50 MG PO TABS
50.0000 mg | ORAL_TABLET | Freq: Every day | ORAL | Status: DC
  Administered 2013-03-24 – 2013-03-25 (×2): 50 mg via ORAL
  Filled 2013-03-24 (×3): qty 1

## 2013-03-24 NOTE — Discharge Summary (Signed)
Physician Discharge Summary  Patient ID: Alexander Johnston MRN: [id number] DOB/AGE: April 08, 1924 80 y.o.  Admit date: 03/18/2013 Discharge date: 03/25/2013  Discharge Diagnoses:  Principal Problem:   CVA (cerebral infarction) Active Problems:   HTN (hypertension)   Depressive disorder, not elsewhere classified   Unspecified hypothyroidism   Multiple abrasions   Discharged Condition: Stable.   Labs:  Basic Metabolic Panel:  Recent Labs Lab 03/19/13 0525 03/25/13 0605  NA 142  --   K 4.4  --   CL 106  --   CO2 24  --   GLUCOSE 97  --   BUN 23  --   CREATININE 0.86 0.74  CALCIUM 8.8  --     CBC:  Recent Labs Lab 03/19/13 0525  WBC 6.7  NEUTROABS 4.0  HGB 12.4*  HCT 37.3*  MCV 88.6  PLT 270    CBG: No results found for this basename: GLUCAP,  in the last 168 hours  Brief HPI:   Alexander Johnston is a 80 y.o. male with history of CAD, IBS, CVA, who was admitted on 03/15/13 with left sided numbness, fall and difficulty speaking. MRI and MRA brain multiple small areas of acute infarct affecting right cerebellum right medulla, superior cerebellar vermis, right temporal lobe, right frontal and parietal lobe, right occipital lobe, right splenium of the corpus callosum as well as left frontal and parietal lobe. TEE was negative for LAA thrombus, PFO/ASD and EF 55%. Grade III plaque descending thoracic aorta and arch. Neurology consulted and recommended changing ASA to 325 mg/daily. Patient with resultant mild dysmetria LUE, mild left hemiparesis with decreased sensation LUE, as well as ataxic gait. CIR was recommended by rehab team.    Hospital Course: Alexander Johnston was admitted to rehab 03/18/2013 for inpatient therapies to consist of PT, ST and OT at least three hours five days a week. Past admission physiatrist, therapy team and rehab RN have worked together to provide customized collaborative inpatient rehab. He was maintained on ASA for secondary stroke prevention. He continues  to have poor insight and awareness into current deficits. Ego support was provided foro adjustment reaction due to loss of independence and Zoloft was resumed and titrated to home dose prior to discharge. Blood pressure and heart rate have been monitored on bid basis and have been well controlled off metoprolol and HCTZ. Po intake has been variable.  Abrasions on right shin and on right 1st and 2nd toes have been monitored and covered with dry dressing. No drainage or signs of infection noted. Admission labs reveal that hypokalemia has resolved. H/H is at baseline. His IBS symptoms have been under control.  He has made good progress but continues to be limited by sensory deficits BLE, impaired balance as well as decreased insight and awareness of deficits.  He will continue to receive home health PT, OT, and ST past discharge.  He will require 24 hours supervision past discharge.   Rehab course: During patient's stay in rehab weekly team conferences were held to monitor patient's progress, set goals and discuss barriers to discharge.  Patient has shown improvement in balance, awareness  as well as ability to compensate for deficits.  He requires close supervision for bathing, dressing and toileting due to limited anticipatory awareness and delayed balance reactions. He requires supervision for transfers and mobility. He is able to ambulate >150 feet in controlled environment but continues to require cues for sequencing and safety when using RW. He requires supervision with stair navigation.  BERG balance score is 22/56 placing him at high risk for falls.  Speech therapy has addressed cognitive-linguistic goals. He requires min verbal cues with complex problem solving tasks. He requires cues to organize, sequence as well as correct errors in medication management.  24 hours supervision is recommended with mobility as well as complex cognitive tasks.   Disposition:  [facility].   Diet:  Heart  healthy.   Special Instructions: 1. Needs 24 hours supervision.  2.  [facility] to provide PT, OT, ST. 3. No driving.        Future Appointments Provider Department Dept Phone   04/25/2013 10:30 AM Charlett Blake, MD [facility] [phone]   05/30/2013 9:45 AM [name] [phone]   05/30/2013 10:15 AM [name], MD [facility] [phone]       Medication List    STOP taking these medications       hydrochlorothiazide 25 MG tablet  Commonly known as:  HYDRODIURIL     loperamide 2 MG capsule  Commonly known as:  IMODIUM     metoprolol 200 MG 24 hr tablet  Commonly known as:  TOPROL-XL     OVER THE COUNTER MEDICATION     pentoxifylline 400 MG CR tablet  Commonly known as:  TRENTAL      TAKE these medications       amLODipine 10 MG tablet  Commonly known as:  NORVASC  Take 10 mg by mouth daily.     aspirin 325 MG tablet  Take 1 tablet (325 mg total) by mouth daily.     bacitracin 500 UNIT/GM ointment  Apply 1 application topically 2 (two) times daily. Cleanse feet with soap and water. Pat dry then apply bacitracin ointment. Cover with dry dressing.     diltiazem 240 MG 24 hr capsule  Commonly known as:  CARDIZEM CD  Take 240 mg by mouth daily.     levothyroxine 25 MCG tablet  Commonly known as:  SYNTHROID, LEVOTHROID  Take 25 mcg by mouth daily before breakfast.     losartan 100 MG tablet  Commonly known as:  COZAAR  Take 100 mg by mouth daily.     nitroGLYCERIN 0.4 MG SL tablet  Commonly known as:  NITROSTAT  Place 0.4 mg under the tongue every 5 (five) minutes as needed for chest pain.     ONE-A-DAY MENS PO  Take 1 tablet by mouth daily.     rosuvastatin 40 MG tablet  Commonly known as:  CRESTOR  Take 40 mg by mouth daily.     sertraline 50 MG tablet  Commonly known as:  ZOLOFT  Take 50 mg by mouth daily.       Follow-up  Information   Follow up with Charlett Blake, MD On 04/25/2013. (Be there at 10:00  for  10:45 am  appointment )    Specialty:  Physical Medicine and Rehabilitation   Contact information:   [place] [address] [phone]       Follow up with [name], MD. Call today. (for follow up in 6 weeks.)    Specialties:  Neurology, Radiology   Contact information:   [address] [place] [address] [phone]       Follow up with [name], MD.   Specialty:  Family Medicine   Contact information:   [place]  [place] [address] [phone]       Follow up with [facility]CP. (ASSISTED LIVING FACILITY TO SET UP PT WITH PCP)       Signed: Bary Leriche 03/25/2013, 4:41 PM

## 2013-03-24 NOTE — Progress Notes (Signed)
Speech Language Pathology Daily Session & Discharge Summary Notes  Patient Details  Name: Alexander Johnston MRN: [id number] Date of Birth: 07-24-1924  Today's Date: 03/24/2013 Time: 1305-1400 Time Calculation (min): 55 min  Short Term Goals: Week 1: SLP Short Term Goal 1 (Week 1): Patient will complete complex problem solving tasks with supervision verbal cues. SLP Short Term Goal 2 (Week 1): Patient will verbilize two modifications he will make to increase safety upon discharge.   Skilled Therapeutic Interventions: Skilled treatment session focused on addressing cognition goals.  SLP facilitated session with complex problem solving task and Min verbal cues to organize, sequence, self-monitor and correct during medication management task.  Patient verbalized appropriate awareness of difficulty with task on Friday but little awareness for today's errors due to a decrease in severity.  Care provider from independent living facility came for portion of session and requested what type of assist patient would need following discharge SLP informed of 24/7 Supervision recommendation and recommend she further discuss therapy recommendations with case manager/social worker.  SLP also facilitated session with discussion regarding what patient anticipated his needs to be following discharge; patient required Supervision questions cues to accurately verbalize adaptations that are required upon discharge.   Patient ready for discharge tomorrow.     FIM:  Comprehension Comprehension Mode: Auditory Comprehension: 4-Understands basic 75 - 89% of the time/requires cueing 10 - 24% of the time Expression Expression Mode: Verbal Expression: 5-Expresses basic needs/ideas: With no assist Social Interaction Social Interaction: 5-Interacts appropriately 90% of the time - Needs monitoring or encouragement for participation or interaction. Problem Solving Problem Solving: 2-Solves basic 25 - 49% of the time - needs  direction more than half the time to initiate, plan or complete simple activities Memory Memory: 4-Recognizes or recalls 75 - 89% of the time/requires cueing 10 - 24% of the time FIM - Eating Eating Activity: 7: Complete independence:no helper  Pain Pain Assessment Pain Assessment: No/denies pain  Therapy/Group: Individual Therapy   Speech Language Pathology Discharge Summary  Patient Details  Name: Alexander Johnston MRN: [id number] Date of Birth: April 02, 1924  Today's Date: 03/24/2013  Patient has met 1 of 2 long term goals.  Patient to discharge at overall Supervision;Min level.  Reasons goals not met: while patient requires Supervision for basic problem solving he requires Min assist for complex    Clinical Impression/Discharge Summary: Patient met 1 out of 2 long term goals during his CIR stay due to progression from Mod assist to Supervision with basic problem solving and Max assist to [place] assist with complex problem solving.  These gains were largely due to increased awareness of new deficits, the benefits of a structured routine and overall patient/family education.  Patient continues to require skilled SLP services at the next level of care to continue to address executive functioning tasks, which will maximize his overall functional independence in his new environment at an independent living facility.      Care Partner:  Caregiver Able to Provide Assistance: No  Type of Caregiver Assistance: Physical;Cognitive  Recommendation:  24 hour supervision/assistance;Home Health SLP  Rationale for SLP Follow Up: Maximize cognitive function and independence;Reduce caregiver burden   Equipment: none   Reasons for discharge: Discharged from hospital   Patient/Family Agrees with Progress Made and Goals Achieved: Yes   See FIM for current functional status  Carmelia Roller., CCC-SLP [phone]  [place] 03/24/2013, 3:34 PM

## 2013-03-24 NOTE — Progress Notes (Signed)
Occupational Therapy Discharge Summary  Patient Details  Name: Alexander Johnston MRN: [id number] Date of Birth: 05-07-1924  Today's Date: 03/24/2013 Time: 0900-0959 Time Calculation (min): 59 min  Second Session Time: 11:32-12:02 Time Calculation (min): 30 mins  Session 1: Pt performed bathing and dressing sit to stand at the sink this session secondary to pt preference.  Pt able to sequence through the task without verbal cueing.  He was also able to stand statically with close supervision to wash his peri area as well as pulling pants over the hips.  Pt still needs close supervision  for transfers and short distance mobility without the use of an assistive device.  Pt also able to stand with grooming tasks of brushing his teeth and his hair with close supervision.    Session 2: Practiced with use of the RW to perform toileting and functional mobility.  Pt incontinent of bowel in his brief when he had gas.  He performed mobility to the bathroom using the RW with supervision.  He needs mod instructional cueing for sequencing sit to stand with the walker as well as cueing to stay inside of the walker during transfers.  Ambulated down to the gym and back with the RW and supervision to finish session.  Pt overall supervision but demonstrates significant forward trunk flexion during this task.    Patient has met 10 of 10 long term goals due to improved balance, ability to compensate for deficits and improved awareness.  Patient to discharge at overall Supervision level.  Pt will transfer to assisted living for safety, 24 hour supervision, and continued OT.  Reasons goals not met: NA  Meal prep goal not addressed as pt is discharging to SNF.  Pt still demonstrates limited anticipatory and emergent awareness during performance of selfcare tasks.  He also continues to be a high fall risk as his balance reactions are delayed compared to normal.  Pt needs use of a shower seat as well as a RW for safety and  balance.  Recommend 24 hour supervision and continued follow-up OT at assisted living facility.  Recommendation:  Patient will benefit from ongoing skilled OT services in assisted living to continue to advance functional skills in the area of BADL.  Equipment: No equipment provided  Reasons for discharge: treatment goals met and discharge from hospital  Patient/family agrees with progress made and goals achieved: Yes  OT Discharge Precautions/Restrictions  Precautions Precautions: Fall Precaution Comments: decreased intellectual awareness Restrictions Weight Bearing Restrictions: No  Pain Pain Assessment Pain Assessment: No/denies pain Pain Score: 0-No pain ADL  See FIM scale for details  Vision/Perception  Vision - History Baseline Vision: Wears glasses only for reading Patient Visual Report: No change from baseline Vision - Assessment Vision Assessment: Vision tested Tracking/Visual Pursuits: Able to track stimulus in all quads without difficulty Visual Fields: No apparent deficits;Other (comment) Perception Perception: Within Functional Limits Praxis Praxis: Intact  Cognition Overall Cognitive Status: Impaired/Different from baseline Arousal/Alertness: Awake/alert Orientation Level: Oriented X4 Attention: Sustained;Focused Focused Attention: Appears intact Sustained Attention: Appears intact Memory: Impaired Memory Impairment: Decreased recall of new information;Decreased short term memory Awareness Impairment: Intellectual impairment Problem Solving: Impaired Problem Solving Impairment: Functional complex;Functional basic Safety/Judgment: Impaired Comments: Pt still demonstrates decreased intellectual and emergent awareness.  Also needs cueing for sequencing and safety when using the RW. Sensation Sensation Light Touch: Appears Intact Stereognosis: Appears Intact Hot/Cold: Appears Intact Proprioception: Appears Intact Coordination Gross Motor Movements  are Fluid and Coordinated: Yes Fine Motor Movements are Fluid  and Coordinated: Yes Motor  Motor Motor: Abnormal postural alignment and control Motor - Discharge Observations: Pt with forward head as well as trunk flexion in standing and with mobility. Mobility  Bed Mobility Supine to Sit: 6: Modified independent (Device/Increase time) Supine to Sit Details: Verbal cues for technique Transfers Transfers: Sit to Stand;Stand to Sit Sit to Stand: 5: Supervision;From bed;With upper extremity assist Sit to Stand Details: Verbal cues for sequencing;Verbal cues for technique Stand to Sit: 5: Supervision Stand to Sit Details (indicate cue type and reason): Verbal cues for technique;Verbal cues for sequencing Stand to Sit Details: Pt needs mod instructiional cueing for placement of hands with sit to stand as he attempts to pull up on the walker.  Trunk/Postural Assessment  Cervical Assessment Cervical Assessment: Exceptions to [facility] Cervical Strength Overall Cervical Strength Comments: Pt presents with cervical protraction in sitting. Thoracic Assessment Thoracic Assessment: Exceptions to [facility] Thoracic Strength Overall Thoracic Strength Comments: Pt with mild thoracic kyphosis in standing. Lumbar Assessment Lumbar Assessment: Exceptions to [facility] Lumbar Strength Overall Lumbar Strength Comments: Pt with posterior pelvic tilt in sitting. Postural Control Protective Responses: Pt still with delayed balance reactions with dynamic standing.  Decreased efficiency with stepping strategies or ankle strategies.   Balance Static Sitting Balance Static Sitting - Balance Support: No upper extremity supported Static Sitting - Level of Assistance: 6: Modified independent (Device/Increase time) Dynamic Sitting Balance Dynamic Sitting - Balance Support: No upper extremity supported Dynamic Sitting - Level of Assistance: 6: Modified independent (Device/Increase time) Static Standing Balance Static Standing -  Balance Support: No upper extremity supported Static Standing - Level of Assistance: 5: Stand by assistance Dynamic Standing Balance Dynamic Standing - Balance Support: Right upper extremity supported;Left upper extremity supported Dynamic Standing - Level of Assistance: 5: Stand by assistance Extremity/Trunk Assessment RUE Assessment RUE Assessment: Within Functional Limits LUE Assessment LUE Assessment: Within Functional Limits  See FIM for current functional status  Nilton Lave OTR/L 03/24/2013, 1:11 PM

## 2013-03-24 NOTE — Progress Notes (Signed)
Subjective/Complaints: 80 y.o. male with history of CAD, IBS, CVA, who was admitted on 03/15/13 with left sided numbness, fall and difficulty speaking. Patient reported to have similar symptoms few days before that resolved on its own. MRI and MRA brain multiple small areas of acute infarct affecting right cerebellum right medulla, superior  cerebellar vermis, right temporal lobe, right frontal and parietal lobe, right occipital lobe, right splenium of the corpus callosum as well as left frontal and parietal lobe. 2D echo with EF 62-69%, grade 1 diastolic dysfunction and no wall abnormality. Carotid dopplers without significant stenosis. Neurology consulted and recommended changing ASA to 325 mg/daily as well as TEE   TEE aortic arch and descending aorta plaque, no PFO Somnolent Review of Systems - not performed pt somnolent  Objective: Vital Signs: Blood pressure 112/50, pulse 51, temperature 97.1 F (36.2 C), temperature source Oral, resp. rate 16, height 6\' 2"  (1.88 m), weight 86.3 kg (190 lb 4.1 oz), SpO2 95.00%. No results found. No results found for this or any previous visit (from the past 72 hour(s)).    General: No acute distress Mood and affect are appropriate Heart: Regular rate and rhythm no rubs murmurs or extra sounds Lungs: Clear to auscultation, breathing unlabored, no rales or wheezes, fine crackles at bases Abdomen: Positive bowel sounds, soft nontender to palpation, nondistended Extremities: No clubbing, cyanosis, or edema Skin: No evidence of breakdown, no evidence of rash Neurologic: Cranial nerves II through XII intact, motor strength is 5/5 in bilateral deltoid, bicep, tricep, grip, hip flexor, knee extensors, ankle dorsiflexor and plantar flexor Sensory exam normal sensation to light touch and proprioception in bilateral upper and reduce LT lower extremities Cerebellar exam normal finger to nose to finger mild dysmetria heel to shin in bilateral upper and lower  extremities Musculoskeletal: Full range of motion in all 4 extremities. No joint swelling   Assessment/Plan: 1. Functional deficits secondary to embolic multiple small areas of acute infarct affecting right cerebellum right medulla, superior  cerebellar vermis, right temporal lobe, right frontal and parietal lobe, right occipital lobe, right splenium of the corpus callosum as well as left frontal and parietal lobe.  which require 3+ hours per day of interdisciplinary therapy in a comprehensive inpatient rehab setting.  Tent D/C in am FIM: FIM - Bathing Bathing Steps Patient Completed: Chest;Left upper leg;Right Arm;Left Arm;Abdomen;Buttocks;Front perineal area;Right upper leg Bathing: 4: Steadying assist  FIM - Upper Body Dressing/Undressing Upper body dressing/undressing steps patient completed: Thread/unthread right sleeve of pullover shirt/dresss;Thread/unthread left sleeve of pullover shirt/dress;Put head through opening of pull over shirt/dress;Pull shirt over trunk;Thread/unthread right sleeve of front closure shirt/dress;Thread/unthread left sleeve of front closure shirt/dress;Button/unbutton shirt;Pull shirt around back of front closure shirt/dress Upper body dressing/undressing: 5: Set-up assist to: Obtain clothing/put away FIM - Lower Body Dressing/Undressing Lower body dressing/undressing steps patient completed: Thread/unthread right underwear leg;Thread/unthread left underwear leg;Pull underwear up/down;Thread/unthread right pants leg;Thread/unthread left pants leg;Don/Doff right shoe;Fasten/unfasten left shoe;Fasten/unfasten right shoe;Fasten/unfasten pants;Don/Doff left shoe;Pull pants up/down;Don/Doff right sock;Don/Doff left sock Lower body dressing/undressing: 4: Steadying Assist  FIM - Toileting Toileting steps completed by patient: Adjust clothing prior to toileting;Performs perineal hygiene;Adjust clothing after toileting Toileting Assistive Devices: Grab bar or rail for  support Toileting: 4: Steadying assist  FIM - Radio producer Devices: [employer] Transfers:  (Pt stands at toilet to urinate, requiring min A for steadying. )  FIM - Control and instrumentation engineer Devices: Best boy: 5: Supine > Sit: Supervision (verbal cues/safety issues);5: Sit >  Supine: Supervision (verbal cues/safety issues);4: Chair or W/C > Bed: Min A (steadying Pt. > 75%);4: Bed > Chair or W/C: Min A (steadying Pt. > 75%)  FIM - Locomotion: Wheelchair Distance: 155 Locomotion: Wheelchair: 0: Activity did not occur FIM - Locomotion: Ambulation Locomotion: Ambulation Assistive Devices: Journalist, newspaper Ambulation/Gait Assistance: 4: Min assist Locomotion: Ambulation: 4: Travels 150 ft or more with minimal assistance (Pt.>75%) (250 feet x2)  Comprehension Comprehension Mode: Auditory Comprehension: 4-Understands basic 75 - 89% of the time/requires cueing 10 - 24% of the time  Expression Expression Mode: Verbal Expression: 5-Expresses basic needs/ideas: With no assist  Social Interaction Social Interaction: 5-Interacts appropriately 90% of the time - Needs monitoring or encouragement for participation or interaction.  Problem Solving Problem Solving: 2-Solves basic 25 - 49% of the time - needs direction more than half the time to initiate, plan or complete simple activities  Memory Memory: 4-Recognizes or recalls 75 - 89% of the time/requires cueing 10 - 24% of the time  Medical Problem List and Plan:  1. DVT Prophylaxis/Anticoagulation: Pharmaceutical: Lovenox  2. Pain Management: prn tylenol.  3. Mood: Improvement in anxiety levels today. Provide ego support. LCSW to follow for evaluation and support.  4. Neuropsych: This patient is not capable of making decisions on his own behalf.  5. Multiple abrasions: right lower leg, left shin and well as Right 1st and 2nd toes.  6. Colon cancer: History of intermittent  diarrhea.  7. HTN: Will monitor with bid checks. Continue Cardizem, Norvasc and Losartan.  8. Dyslipidemia: continue Crestor.    LOS (Days) 6 A FACE TO FACE EVALUATION WAS PERFORMED  Alexander Johnston 03/24/2013, 7:23 AM

## 2013-03-24 NOTE — Progress Notes (Signed)
Social Work Patient ID: Alexander Johnston, male   DOB: 18-Jun-1924, 80 y.o.   MRN: [id number] Met with [facility]-representative here to ask questions regarding pt's care needs.  Informed team's recommendation is 24 hour supervision level. She disucssed with Emily-PT and Melissa-SPT pt's deficits and care he requires.  Pt wants to use [facility] since wife had them.  Will make referral and follow up  With daughter and pt.

## 2013-03-24 NOTE — Progress Notes (Signed)
Social Work Patient ID: Alexander Johnston, male   DOB: 1924-05-23, 80 y.o.   MRN: [id number] Spoke with daughter via telephone to confirm discharge tomorrow.  Pt has a independent living bed to go to.  Daughter will arrange for more assist and is aware of the team's Recommendations of 24 hour care.  She will be here in the am to transport to facility.  Will order a rolling walker and work toward discharge tomorrow.

## 2013-03-24 NOTE — Plan of Care (Signed)
Problem: RH Simple Meal Prep Goal: LTG Patient will perform simple meal prep w/assist (OT) LTG: Patient will perform simple meal prep with assistance, with/without cues (OT).  Outcome: Not Applicable Date Met:  79/44/46 Pt did not attempt meal prep based on discharge changing to assisted living.

## 2013-03-24 NOTE — Progress Notes (Signed)
Physical Therapy Discharge Summary  Patient Details  Name: Alexander Johnston MRN: [id number] Date of Birth: Jun 11, 1924  Today's Date: 03/24/2013 Time: 9983-3825 Time Calculation (min): 48 min  Patient has met 10 of 10 long term goals due to improved activity tolerance, improved balance, ability to compensate for deficits and improved coordination.  Patient to discharge at an ambulatory level Supervision. Pt is to transfer to ILF tomorrow and per CSW have different levels of care there.  Have informed personnel from [facility] that he will need 24/7 due to decreased safety awareness/cognitive impairments and decreased balance.   Reasons goals not met: n/a  Recommendation:  Patient will benefit from ongoing skilled PT services in home health setting (via ILF) to continue to advance safe functional mobility, address ongoing impairments in decreased overall strength, decreased awareness, decreased attention, decreased balance, gait abnormality, decreased balance reactions, and minimize fall risk.  Equipment: RW  Reasons for discharge: treatment goals met and discharge from hospital  Patient/family agrees with progress made and goals achieved: Yes  PT treatment/Intervention:   Pt received lying in bed this afternoon, agreeable to therapy.  Performed bed mobility at supervision and was able to don shoes independently sitting at EOB.  Had pt self propel w/c 155' to gym using BUEs at mod I level.  Once in gym, performed gait with use of RW at supervision level >150' in controlled and carpeted environment.  Continue to provide cues for correct positioning inside of RW as he tends to turn RW and feet remain outside of RW, upright posture and increased stride length.  Performed car transfer at supervision level with min cues for safety.  Performed BERG balance test with score of 22/56, which continues to place pt at significant (100%) fall risk category.  Pt continues to excuse his balance and states "I wasn't  good at that before" or "I would never do that."  Performed floor transfer at supervision level.  Noted improvement in pts ability to get up, however requires total assist cues for what to check before he gets up vs when to call 911.  Performed stair negotiation up/down 4, 6" steps and 6, 4" steps with single handrail for community use at supervision level.  Min cues for safety and step to technique.  Pt ambulated back to room and left in w/c with all needs in reach. Neuropsych in room for next session.   PT Discharge Precautions/Restrictions Precautions Precautions: Fall Precaution Comments: decreased intellectual awareness Restrictions Weight Bearing Restrictions: No Vital Signs Therapy Vitals Temp: 89.3 F (31.8 C) Temp src: Oral Pulse Rate: 69 Resp: 17 BP: 134/65 mmHg Patient Position, if appropriate: Sitting Oxygen Therapy SpO2: 95 % O2 Device: None (Room air) Pain Pain Assessment Pain Assessment: No/denies pain Pain Score: 0-No pain Vision/Perception  Vision - History Baseline Vision: Wears glasses only for reading Patient Visual Report: No change from baseline Vision - Assessment Vision Assessment: Vision tested Tracking/Visual Pursuits: Able to track stimulus in all quads without difficulty Visual Fields: No apparent deficits;Other (comment) Perception Perception: Within Functional Limits Praxis Praxis: Intact  Cognition Overall Cognitive Status: Impaired/Different from baseline Arousal/Alertness: Awake/alert Orientation Level: Oriented X4 Attention: Sustained;Focused Focused Attention: Appears intact Sustained Attention: Appears intact Sustained Attention Impairment: Functional basic Memory: Impaired Memory Impairment: Decreased recall of new information;Decreased short term memory Decreased Short Term Memory: Verbal complex Awareness: Impaired Awareness Impairment: Intellectual impairment Problem Solving: Impaired Problem Solving Impairment: Functional  complex;Functional basic Safety/Judgment: Impaired Comments: Pt still demonstrates decreased intellectual and emergent awareness.  Also  needs cueing for sequencing and safety when using the RW. Sensation Sensation Light Touch: Appears Intact Stereognosis: Not tested Hot/Cold: Not tested Proprioception: Appears Intact Additional Comments: Continues to have absent sensation in B feet Coordination Gross Motor Movements are Fluid and Coordinated: Yes Fine Motor Movements are Fluid and Coordinated: Yes Heel Shin Test: Pt demonstrates decreased fluidity of movement, however no dysmetria noted.  Motor  Motor Motor: Abnormal postural alignment and control Motor - Discharge Observations: Pt with forward head as well as trunk flexion in standing and with mobility.  Mobility Bed Mobility Supine to Sit: 6: Modified independent (Device/Increase time) Supine to Sit Details: Verbal cues for technique Sit to Supine: 5: Supervision Transfers Sit to Stand: 5: Supervision;From bed;With upper extremity assist Sit to Stand Details: Verbal cues for sequencing;Verbal cues for technique Stand to Sit: 5: Supervision Stand to Sit Details (indicate cue type and reason): Verbal cues for technique;Verbal cues for sequencing Stand to Sit Details: Pt needs mod instructiional cueing for placement of hands with sit to stand as he attempts to pull up on the walker. Stand Pivot Transfers: 5: Supervision Stand Pivot Transfer Details: Verbal cues for sequencing;Verbal cues for technique;Verbal cues for precautions/safety Locomotion  Ambulation Ambulation: Yes Ambulation/Gait Assistance: 5: Supervision Ambulation Distance (Feet): 200 Feet Assistive device: Rolling walker Ambulation/Gait Assistance Details: Verbal cues for technique;Verbal cues for safe use of DME/AE;Verbal cues for precautions/safety Gait Gait: Yes Gait Pattern: Impaired Gait Pattern: Right foot flat;Left foot flat;Step-through pattern;Trunk  flexed;Shuffle Stairs / Additional Locomotion Stairs: Yes Stairs Assistance: 5: Supervision Stairs Assistance Details: Verbal cues for sequencing;Verbal cues for technique;Verbal cues for precautions/safety Stair Management Technique: One rail Right;Step to pattern;Forwards Number of Stairs: 10 Height of Stairs: 4 Wheelchair Mobility Wheelchair Mobility: Yes Wheelchair Assistance: 6: Modified independent (Device/Increase time) Environmental health practitioner: Both upper extremities Wheelchair Parts Management: Supervision/cueing Distance: 155  Trunk/Postural Assessment  Cervical Assessment Cervical Assessment: Exceptions to [facility] Cervical Strength Overall Cervical Strength Comments: Pt presents with cervical protraction in sitting. Thoracic Assessment Thoracic Assessment: Exceptions to [facility] Thoracic Strength Overall Thoracic Strength Comments: Pt with mild thoracic kyphosis in standing. Lumbar Assessment Lumbar Assessment: Exceptions to [facility] Lumbar Strength Overall Lumbar Strength Comments: Pt with posterior pelvic tilt in sitting. Postural Control Protective Responses: Pt still with delayed balance reactions with dynamic standing.  Decreased efficiency with stepping strategies or ankle strategies.   Balance Balance Balance Assessed: Yes Standardized Balance Assessment Standardized Balance Assessment: Berg Balance Test Berg Balance Test Sit to Stand: Able to stand  independently using hands Standing Unsupported: Able to stand 2 minutes with supervision Sitting with Back Unsupported but Feet Supported on Floor or Stool: Able to sit safely and securely 2 minutes Stand to Sit: Controls descent by using hands Transfers: Able to transfer safely, definite need of hands Standing Unsupported with Eyes Closed: Needs help to keep from falling Standing Ubsupported with Feet Together: Needs help to attain position and unable to hold for 15 seconds From Standing, Reach Forward with Outstretched Arm:  Reaches forward but needs supervision From Standing Position, Pick up Object from Floor: Able to pick up shoe, needs supervision From Standing Position, Turn to Look Behind Over each Shoulder: Turn sideways only but maintains balance Turn 360 Degrees: Needs assistance while turning Standing Unsupported, Alternately Place Feet on Step/Stool: Needs assistance to keep from falling or unable to try Standing Unsupported, One Foot in Front: Loses balance while stepping or standing Standing on One Leg: Unable to try or needs assist to prevent fall Total Score:  22 Static Sitting Balance Static Sitting - Balance Support: No upper extremity supported Static Sitting - Level of Assistance: 6: Modified independent (Device/Increase time) Dynamic Sitting Balance Dynamic Sitting - Balance Support: No upper extremity supported Dynamic Sitting - Level of Assistance: 6: Modified independent (Device/Increase time) Static Standing Balance Static Standing - Balance Support: No upper extremity supported Static Standing - Level of Assistance: 5: Stand by assistance Dynamic Standing Balance Dynamic Standing - Balance Support: Right upper extremity supported;Left upper extremity supported Dynamic Standing - Level of Assistance: 5: Stand by assistance Extremity Assessment  RUE Assessment RUE Assessment: Within Functional Limits LUE Assessment LUE Assessment: Within Functional Limits RLE Assessment RLE Assessment: Within Functional Limits (hip flex 3+/5) LLE Assessment LLE Assessment: Exceptions to [facility] LLE Strength LLE Overall Strength: Deficits LLE Overall Strength Comments: Grossly WFL, however demonstrates 3/5 hip flex strength.   See FIM for current functional status  Denice Bors 03/24/2013, 4:29 PM

## 2013-03-24 NOTE — Discharge Instructions (Signed)
Inpatient Rehab Discharge Instructions  Alexander Johnston Discharge date and time: 03/25/13   Activities/Precautions/ Functional Status: Activity: activity as tolerated Diet: cardiac diet Wound Care: Cleanse abrasions on toes daily with soap and water. Pat dry and apply bacitracin cream. Cover with dry dressing. Wear shoes whenever out of bed.  Keep a dry foam dressing on abrasions on right shin.   Functional status:  ___ No restrictions     ___ Walk up steps independently _X__ 24/7 supervision/assistance   ___ Walk up steps with assistance ___ Intermittent supervision/assistance  ___ Bathe/dress independently ___ Walk with walker     ___ Bathe/dress with assistance ___ Walk Independently    ___ Shower independently ___ Walk with assistance    ___ Shower with assistance _X__ No alcohol     ___ Return to work/school ________  Special Instructions:    COMMUNITY REFERRALS UPON DISCHARGE:    Home Health:   PT, OT, SPT  Agency:[facility] BZJIR:[phone] Date of last service:03/25/2013  Medical Equipment/Items [place]   [phone]   GENERAL COMMUNITY RESOURCES FOR PATIENT/FAMILY: Support Groups:[facility] SUPPORT GROUP  STROKE/TIA DISCHARGE INSTRUCTIONS SMOKING Cigarette smoking nearly doubles your risk of having a stroke & is the single most alterable risk factor  If you smoke or have smoked in the last 12 months, you are advised to quit smoking for your health.  Most of the excess cardiovascular risk related to smoking disappears within a year of stopping.  Ask you doctor about anti-smoking medications  [place] Quit Line: 1-800-QUIT NOW  Free Smoking Cessation Classes [phone]  CHOLESTEROL Know your levels; limit fat & cholesterol in your diet  Lipid Panel     Component Value Date/Time   CHOL 140 03/15/2013 0500   TRIG 89 03/15/2013 0500   HDL 36* 03/15/2013 0500   CHOLHDL 3.9 03/15/2013 0500   VLDL 18 03/15/2013 0500   LDLCALC 86 03/15/2013 0500      Many patients benefit from treatment even if their cholesterol is at goal.  Goal: Total Cholesterol (CHOL) less than 160  Goal:  Triglycerides (TRIG) less than 150  Goal:  HDL greater than 40  Goal:  LDL (LDLCALC) less than 100   BLOOD PRESSURE American Stroke Association blood pressure target is less that 120/80 mm/Hg  Your discharge blood pressure is:  BP: 112/50 mmHg  Monitor your blood pressure  Limit your salt and alcohol intake  Many individuals will require more than one medication for high blood pressure  DIABETES (A1c is a blood sugar average for last 3 months) Goal HGBA1c is under 7% (HBGA1c is blood sugar average for last 3 months)  Diabetes:     Lab Results  Component Value Date   HGBA1C 5.7* 03/15/2013     Your HGBA1c can be lowered with medications, healthy diet, and exercise.  Check your blood sugar as directed by your physician  Call your physician if you experience unexplained or low blood sugars.  PHYSICAL ACTIVITY/REHABILITATION Goal is 30 minutes at least 4 days per week  Activity: No driving, Therapies: See above Return to work:  N/A  Activity decreases your risk of heart attack and stroke and makes your heart stronger.  It helps control your weight and blood pressure; helps you relax and can improve your mood.  Participate in a regular exercise program.  Talk with your doctor about the best form of exercise for you (dancing, walking, swimming, cycling).  DIET/WEIGHT Goal is to maintain a healthy weight  Your discharge diet is: Cardiac thin liquids Your height is:  Height: 6\' 2"  (188 cm) Your current weight is: Weight: 86.3 kg (190 lb 4.1 oz) Your Body Mass Index (BMI) is:  BMI (Calculated): 24.5  Following the type of diet specifically designed for you will help prevent another stroke.  Your goal weight: at goal weight.    Your goal Body Mass Index (BMI) is 19-24.  Healthy food habits can help reduce 3 risk  factors for stroke:  High cholesterol, hypertension, and excess weight.  RESOURCES Stroke/Support Group:  Call [phone]   STROKE EDUCATION PROVIDED/REVIEWED AND GIVEN TO PATIENT Stroke warning signs and symptoms How to activate emergency medical system (call 911). Medications prescribed at discharge. Need for follow-up after discharge. Personal risk factors for stroke. Pneumonia vaccine given:  Flu vaccine given:  My questions have been answered, the writing is legible, and I understand these instructions.  I will adhere to these goals & educational materials that have been provided to me after my discharge from the hospital.       My questions have been answered and I understand these instructions. I will adhere to these goals and the provided educational materials after my discharge from the hospital.  Patient/Caregiver Signature _______________________________ Date __________  Clinician Signature _______________________________________ Date __________  Please bring this form and your medication list with you to all your follow-up doctor's appointments.

## 2013-03-25 DIAGNOSIS — T07XXXA Unspecified multiple injuries, initial encounter: Secondary | ICD-10-CM | POA: Diagnosis present

## 2013-03-25 DIAGNOSIS — E039 Hypothyroidism, unspecified: Secondary | ICD-10-CM | POA: Diagnosis present

## 2013-03-25 LAB — CREATININE, SERUM
CREATININE: 0.74 mg/dL (ref 0.50–1.35)
GFR, EST NON AFRICAN AMERICAN: 81 mL/min — AB (ref >90)

## 2013-03-25 MED ORDER — ASPIRIN 325 MG PO TABS: 325.0000 mg | ORAL_TABLET | Freq: Every day | ORAL | Status: DC

## 2013-03-25 MED ORDER — BACITRACIN 500 UNIT/GM EX OINT: 1.0000 "application " | TOPICAL_OINTMENT | Freq: Two times a day (BID) | CUTANEOUS | Status: AC

## 2013-03-25 NOTE — Progress Notes (Signed)
Subjective/Complaints: 80 y.o. male with history of CAD, IBS, CVA, who was admitted on 03/15/13 with left sided numbness, fall and difficulty speaking. Patient reported to have similar symptoms few days before that resolved on its own. MRI and MRA brain multiple small areas of acute infarct affecting right cerebellum right medulla, superior  cerebellar vermis, right temporal lobe, right frontal and parietal lobe, right occipital lobe, right splenium of the corpus callosum as well as left frontal and parietal lobe. 2D echo with EF 08-67%, grade 1 diastolic dysfunction and no wall abnormality. Carotid dopplers without significant stenosis. Neurology consulted and recommended changing ASA to 325 mg/daily as well as TEE   TEE aortic arch and descending aorta plaque, no PFO Pt is disappointed, not going back to [facility] - not performed pt somnolent  Objective: Vital Signs: Blood pressure 139/57, pulse 69, temperature 98.1 F (36.7 C), temperature source Oral, resp. rate 16, height 6' 2"  (1.88 m), weight 86.3 kg (190 lb 4.1 oz), SpO2 96.00%. No results found. Results for orders placed during the hospital encounter of 03/18/13 (from the past 72 hour(s))  CREATININE, SERUM     Status: Abnormal   Collection Time    03/25/13  6:05 AM      Result Value Range   Creatinine, Ser 0.74  0.50 - 1.35 mg/dL   GFR calc non Af Amer 81 (*) >90 mL/min   GFR calc Af Amer >90  >90 mL/min   Comment: (NOTE)     The eGFR has been calculated using the CKD EPI equation.     This calculation has not been validated in all clinical situations.     eGFR's persistently <90 mL/min signify possible Chronic Kidney     Disease.      General: No acute distress Mood and affect are appropriate Heart: Regular rate and rhythm no rubs murmurs or extra sounds Lungs: Clear to auscultation, breathing unlabored, no rales or wheezes, fine crackles at bases Abdomen: Positive bowel sounds, soft nontender to  palpation, nondistended Extremities: No clubbing, cyanosis, or edema Skin: No evidence of breakdown, no evidence of rash Neurologic: Cranial nerves II through XII intact, motor strength is 5/5 in bilateral deltoid, bicep, tricep, grip, hip flexor, knee extensors, ankle dorsiflexor and plantar flexor Sensory exam normal sensation to light touch and proprioception in bilateral upper and reduce LT lower extremities Cerebellar exam normal finger to nose to finger mild dysmetria heel to shin in bilateral upper and lower extremities Musculoskeletal: Full range of motion in all 4 extremities. No joint swelling   Assessment/Plan: 1. Functional deficits secondary to embolic multiple small areas of acute infarct affecting right cerebellum right medulla, superior  cerebellar vermis, right temporal lobe, right frontal and parietal lobe, right occipital lobe, right splenium of the corpus callosum as well as left frontal and parietal lobe.       Medically stable for D/C See summary FIM: FIM - Bathing Bathing Steps Patient Completed: Chest;Left upper leg;Right Arm;Left Arm;Abdomen;Buttocks;Front perineal area;Right upper leg Bathing: 5: Supervision: Safety issues/verbal cues  FIM - Upper Body Dressing/Undressing Upper body dressing/undressing steps patient completed: Thread/unthread right sleeve of pullover shirt/dresss;Thread/unthread left sleeve of pullover shirt/dress;Put head through opening of pull over shirt/dress;Pull shirt over trunk;Thread/unthread right sleeve of front closure shirt/dress;Thread/unthread left sleeve of front closure shirt/dress;Button/unbutton shirt;Pull shirt around back of front closure shirt/dress Upper body dressing/undressing: 5: Supervision: Safety issues/verbal cues FIM - Lower Body Dressing/Undressing Lower body dressing/undressing steps patient completed: Thread/unthread right underwear leg;Thread/unthread left underwear  leg;Pull underwear up/down;Thread/unthread right  pants leg;Thread/unthread left pants leg;Fasten/unfasten pants;Pull pants up/down;Don/Doff right sock;Don/Doff left sock Lower body dressing/undressing: 5: Supervision: Safety issues/verbal cues  FIM - Toileting Toileting steps completed by patient: Adjust clothing prior to toileting;Performs perineal hygiene;Adjust clothing after toileting Toileting Assistive Devices: Grab bar or rail for support Toileting: 5: Supervision: Safety issues/verbal cues  FIM - Radio producer Devices: Insurance account manager Transfers: 5-To toilet/BSC: Supervision (verbal cues/safety issues);5-From toilet/BSC: Supervision (verbal cues/safety issues)  FIM - Control and instrumentation engineer Devices: Copy: 5: Supine > Sit: Supervision (verbal cues/safety issues);5: Sit > Supine: Supervision (verbal cues/safety issues);5: Bed > Chair or W/C: Supervision (verbal cues/safety issues);5: Chair or W/C > Bed: Supervision (verbal cues/safety issues)  FIM - Locomotion: Wheelchair Distance: 155 Locomotion: Wheelchair: 6: Travels 150 ft or more, turns around, maneuvers to table, bed or toilet, negotiates 3% grade: maneuvers on rugs and over door sills independently FIM - Locomotion: Ambulation Locomotion: Ambulation Assistive Devices: Administrator Ambulation/Gait Assistance: 5: Supervision Locomotion: Ambulation: 5: Travels 150 ft or more with supervision/safety issues  Comprehension Comprehension Mode: Auditory Comprehension: 4-Understands basic 75 - 89% of the time/requires cueing 10 - 24% of the time  Expression Expression Mode: Verbal Expression: 5-Expresses basic needs/ideas: With no assist  Social Interaction Social Interaction: 5-Interacts appropriately 90% of the time - Needs monitoring or encouragement for participation or interaction.  Problem Solving Problem Solving: 2-Solves basic 25 - 49% of the time - needs direction more than half the time to  initiate, plan or complete simple activities  Memory Memory: 4-Recognizes or recalls 75 - 89% of the time/requires cueing 10 - 24% of the time  Medical Problem List and Plan:  1. DVT Prophylaxis/Anticoagulation: Pharmaceutical: Lovenox  2. Pain Management: prn tylenol.  3. Mood: Improvement in anxiety levels today. Provide ego support. LCSW to follow for evaluation and support.  4. Neuropsych: This patient is not capable of making decisions on his own behalf.  5. Multiple abrasions: right lower leg, left shin and well as Right 1st and 2nd toes.  6. Colon cancer: History of intermittent diarrhea.  7. HTN: Will monitor with bid checks. Continue Cardizem, Norvasc and Losartan.  8. Dyslipidemia: continue Crestor.    LOS (Days) 7 A FACE TO FACE EVALUATION WAS PERFORMED  KIRSTEINS,ANDREW E 03/25/2013, 7:20 AM

## 2013-03-25 NOTE — Progress Notes (Signed)
Social Work Discharge Note Discharge Note  The overall goal for the admission was met for:   Discharge location: Yes-[facility] WITH ADDED SERVICES  Length of Stay: Yes-7 DAYS  Discharge activity level: Yes-SUPERVISION LEVEL  Home/community participation: Yes  Services provided included: MD, RD, PT, OT, SLP, RN, CM, TR, Pharmacy, Neuropsych and SW  Financial Services: Medicare and Private Insurance: [place]  Follow-up services arranged: Home Health: [place] [facility]-PT,OT,SPT and Other: [place]  Comments (or additional information):DAUGHTER HERE AND AWARE OF THE TEAM'S RECOMMENDATION OF ASSISTED LIVING FOR THE SUPERVISION LEVEL  Patient/Family verbalized understanding of follow-up arrangements: Yes  Individual responsible for coordination of the follow-up plan: CELESTE-DAUGHTER  Confirmed correct DME delivered: Elease Hashimoto 03/25/2013    Elease Hashimoto

## 2013-03-25 NOTE — Progress Notes (Signed)
Patient discharged with daughters at 85 with all belongings . [name] , PA gave and reviewed discharge instructions . pateint and daughters verbalized understanding of discharge instructions.           Alexander Johnston

## 2013-03-25 NOTE — Plan of Care (Signed)
Problem: RH SKIN INTEGRITY Goal: RH STG SKIN FREE OF INFECTION/BREAKDOWN Skin free from breakdown/infection while on rehab with min assist  Outcome: Completed/Met Date Met:  03/25/13 Continue dressing change BID with bacitracin ointment skin red .      

## 2013-03-25 NOTE — Consult Note (Signed)
INITIAL DIAGNOSTIC EVALUATION - CONFIDENTIAL  Inpatient Rehabilitation   MEDICAL NECESSITY:  Mr. Alexander Johnston was seen on the [facility] for an initial diagnostic evaluation owing to the patient's diagnosis of multiple strokes.   According to medical records, Mr. [name] was admitted to the rehab unit owing to functional deficits secondary to multiple right distribution brain infarcts; possibly owing to an embolic source. He has a history of CAD and was admitted on 03/15/13 with left-sided numbness, falling, and difficulty with speech. Patient reported to have similar symptoms a few days before that which resolved on their own. MRI and MRA brain reportedly revealed multiple small areas of acute infarct affecting right cerebellum, right medulla, superior cerebellar vermis, right temporal lobe, right frontal and parietal lobe, right occipital lobe, right splenium of the corpus callosum, as well as left frontal and parietal lobes. The patient purportedly exhibits poor insight, and lack of awareness into current deficits as well as a tendency to run into objects on the left.   Of note, speech pathology spoke with this provider before the patent's appointment. It was mentioned that they performed brief cognitive testing with the patient in the form of the Cognistat which purportedly revealed generally intact cognitive functioning with the exception of below average memory and attention. See Epic for details.   During today's visit, Mr. [name] reported suffering from mild memory loss and decreased attention post strokes though he did not feel they were significant. He was very open about being under a lot of emotional distress describing his current mood as "awful" and feeling full of grief. He mentioned living through multiple "calamities" throughout his life but thankfully he has an "unbelievable" amount of support. In fact, he said that they reason he is discharging  so soon from the rehab unit is that his son-in-law has connections with a local independent living community which is where he will be living.   Mr. [name] said that he is treated for depression and stress via an antidepressant that he finds to be helpful. No support groups or counseling has been implemented. He said that he looks to his religion for strength and described himself as a survivor. The patient's wife reportedly has advanced Alzheimer's disease which is a part of his stress and he said he was unhappy that he could not stay with her, as she is living in a different facility in [place].     Mr. [name] purportedly feels that he has made great strides in therapy and had nothing but great things to say about his treatment team. He is grateful for the time and quality of care he received while on the rehab unit.    PROCEDURES ADMINISTERED: [1 unit [id number] on 03/24/13] Diagnostic clinical interview  Review of available records  Emotional & Behavioral Evaluation: Mr. [name] was appropriately dressed for season and situation, and he appeared tidy and well-groomed. Normal posture was noted. He was friendly and rapport was easily established. His speech was as expected and he was able to express ideas effectively. His affect was appropriately modulated, though he seemed depressed. Attention and motivation were good.   From an emotional standpoint, Mr. [name] endorsed suffering from at least a moderate degree of depression and anxiety. He denied any adjustment issues regarding his hospital stay. Suicidal/homicidal ideation, plan or intent was denied. No manic or hypomanic episodes were reported. The patient denied ever experiencing any auditory/visual hallucinations. No major behavioral or personality changes were endorsed.    Overall, while  Mr. [name] admitted to suffering from a degree of cognitive deficit he was disinterested in cognitive testing or further follow-up via neuropsychology. And  though he admits to being under a great deal of emotional distress he again expressed disinterest in any type of follow-up whether as an inpatient or upon discharge. He was encouraged to seek as much support from his family and friends as well as community groups as possible. He generally appeared to dismiss recommendations.   Overall, it is not clear whether Mr. [name] presentation reflects a true lack of awareness into any cognitive deficits. Neuropsychological testing as an outpatient would be valuable given the nature and extent of the strokes the patient suffered, though he appears unwilling to comply. I would be happy to see him as outpatient if he were agreeable. In regard to mood, he should continue to follow-up with his PCP regarding his antidepressant. Supportive psychotherapy and grief counseling would also likely be beneficial.     Rutha Bouchard, Psy.D.  Clinical Neuropsychologist

## 2013-04-25 ENCOUNTER — Inpatient Hospital Stay: Payer: Medicare Other | Admitting: Physical Medicine & Rehabilitation

## 2013-05-30 ENCOUNTER — Ambulatory Visit (HOSPITAL_BASED_OUTPATIENT_CLINIC_OR_DEPARTMENT_OTHER): Payer: Medicare Other | Admitting: Oncology

## 2013-05-30 ENCOUNTER — Telehealth: Payer: Self-pay | Admitting: Oncology

## 2013-05-30 ENCOUNTER — Ambulatory Visit: Payer: Medicare Other

## 2013-05-30 ENCOUNTER — Other Ambulatory Visit (HOSPITAL_BASED_OUTPATIENT_CLINIC_OR_DEPARTMENT_OTHER): Payer: Medicare Other

## 2013-05-30 ENCOUNTER — Inpatient Hospital Stay: Payer: Medicare Other | Admitting: Physical Medicine & Rehabilitation

## 2013-05-30 VITALS — BP 121/55 | HR 70 | Temp 97.9°F | Resp 18 | Ht 74.0 in | Wt 205.6 lb

## 2013-05-30 DIAGNOSIS — C20 Malignant neoplasm of rectum: Secondary | ICD-10-CM

## 2013-05-30 NOTE — Telephone Encounter (Signed)
gave pt appt for lab and MD for August 2015 °

## 2013-05-30 NOTE — Progress Notes (Signed)
  [place] OFFICE PROGRESS NOTE   Diagnosis: Rectal cancer INTERVAL HISTORY:   Mr. Alexander Johnston returns as scheduled. He was admitted in January with an acute CVA and had a lengthy hospital admission including a rehabilitation stay. He reports a stroke symptoms have resolved. He did not see Dr. Barry Dienes. He reports continued fecal incontinence. No rectal pain or bleeding. He reports the incontinence has improved.  Objective:  Vital signs in last 24 hours:  Blood pressure 121/55, pulse 70, temperature 97.9 F (36.6 C), temperature source Oral, resp. rate 18, height 6\' 2"  (1.88 m), weight 205 lb 9.6 oz (93.26 kg).    HEENT: Neck without mass Lymphatics: No cervical, supra-clavicular, axillary, or inguinal nodes Resp: Lungs clear bilaterally Cardio: Regular rate and rhythm GI: No hepatosplenomegaly Vascular: No leg edema Neuro: Alert and oriented  Rectal: Markedly diminished rectal tone, light brown stool, no rectal mass   Medications: I have reviewed the patient's current medications.  Assessment/Plan: 1. Adenocarcinoma of the distal rectum.  CTs of the chest, abdomen and pelvis without evidence of metastatic disease.  EUS 11/07/2012 showed a uT3N0 circumferential nonobstructing 7 cm long adenocarcinoma in the rectum with distal edge 3-4 cm from the anal verge.  Initiation of concurrent radiation and Xeloda 11/27/2012, completed 01/06/2013 2. Elevated pretreatment CEA, normal 02/25/2013 3. Diarrhea/rectal urgency secondary to #1. Resolved. 4. History of Weight loss. 5. History of coronary artery disease. 6. Depression. 7. Multiple colon polyps on colonoscopy 10/17/2012. 8. History of Mild anemia secondary to rectal bleeding and chemotherapy 9. Fecal incontinence-potentially related to pelvic radiation, the rectal tumor, or a spine issue 10. Acute CVA January 2015  Disposition:  Alexander Johnston completed concurrent Xeloda and radiation for treatment of rectal cancer.  He declines an endoscopic or surgical evaluation. I offered him a referral to the physical therapy program to evaluate the fecal incontinence. He declined this referral. There is no palpable rectal mass on exam today.  I discussed the situation with his daughter. He will return for an office visit here in 4 months.  [name], MD  05/30/2013  10:31 AM

## 2013-05-31 LAB — CEA: CEA: 5.3 ng/mL — AB (ref 0.0–5.0)

## 2013-06-02 ENCOUNTER — Telehealth: Payer: Self-pay | Admitting: *Deleted

## 2013-06-02 DIAGNOSIS — C20 Malignant neoplasm of rectum: Secondary | ICD-10-CM

## 2013-06-02 DIAGNOSIS — C189 Malignant neoplasm of colon, unspecified: Secondary | ICD-10-CM

## 2013-06-02 NOTE — Telephone Encounter (Signed)
Message copied by Dina Rich Lakesha Levinson P on Mon Jun 02, 2013 10:42 AM ------      Message from: Betsy Coder B      Created: Mon Jun 02, 2013  7:44 AM       Please call patient, cea is mildly elevated , f/u as scheduled with CEA ------

## 2013-06-02 NOTE — Telephone Encounter (Signed)
Per Dr. Benay Spice; notified pt's daughter cea is mildly elevated, will re-check again @ f/u appt.  Pt's daughter verbalized understanding and confirmed appt 10/10/13.

## 2013-06-30 ENCOUNTER — Encounter: Payer: Medicare Other | Attending: Physical Medicine & Rehabilitation

## 2013-06-30 ENCOUNTER — Encounter: Payer: Self-pay | Admitting: Physical Medicine & Rehabilitation

## 2013-06-30 ENCOUNTER — Ambulatory Visit (HOSPITAL_BASED_OUTPATIENT_CLINIC_OR_DEPARTMENT_OTHER): Payer: Medicare Other | Admitting: Physical Medicine & Rehabilitation

## 2013-06-30 VITALS — BP 139/71 | HR 73 | Resp 14 | Wt 203.0 lb

## 2013-06-30 DIAGNOSIS — G629 Polyneuropathy, unspecified: Secondary | ICD-10-CM

## 2013-06-30 DIAGNOSIS — I6350 Cerebral infarction due to unspecified occlusion or stenosis of unspecified cerebral artery: Secondary | ICD-10-CM

## 2013-06-30 DIAGNOSIS — I639 Cerebral infarction, unspecified: Secondary | ICD-10-CM

## 2013-06-30 DIAGNOSIS — I10 Essential (primary) hypertension: Secondary | ICD-10-CM | POA: Insufficient documentation

## 2013-06-30 DIAGNOSIS — G609 Hereditary and idiopathic neuropathy, unspecified: Secondary | ICD-10-CM

## 2013-06-30 DIAGNOSIS — I2510 Atherosclerotic heart disease of native coronary artery without angina pectoris: Secondary | ICD-10-CM | POA: Insufficient documentation

## 2013-06-30 DIAGNOSIS — I69919 Unspecified symptoms and signs involving cognitive functions following unspecified cerebrovascular disease: Secondary | ICD-10-CM | POA: Insufficient documentation

## 2013-06-30 DIAGNOSIS — C189 Malignant neoplasm of colon, unspecified: Secondary | ICD-10-CM | POA: Insufficient documentation

## 2013-06-30 NOTE — Patient Instructions (Signed)
Dr Leonie Man is the neurologist [facility]  Recommend no driving secondary to severe neuropathy in both feet which can limit reaction time

## 2013-06-30 NOTE — Progress Notes (Signed)
Subjective:    Patient ID: Alexander Johnston, male    DOB: 1924-10-10, 80 y.o.   MRN: [id number] 80 y.o. male with history of CAD, IBS, CVA, who was admitted on 03/15/13 with left sided numbness, fall and difficulty speaking. MRI and MRA brain multiple small areas of acute infarct affecting right cerebellum right medulla, superior cerebellar vermis, right temporal lobe, right frontal and parietal lobe, right occipital lobe, right splenium of the corpus callosum as well as left frontal and parietal lobe. TEE was negative for LAA thrombus, PFO/ASD and EF 55%. Grade III plaque descending thoracic aorta and arch. Neurology consulted and recommended changing ASA to 325 mg/daily. Patient with resultant mild dysmetria LUE, mild left hemiparesis with decreased sensation LUE, as well as ataxic gait  HPI  CIR  Admit date: 03/18/2013  Discharge date: 03/25/2013   Upon discharge from rehabilitation: He requires close supervision for bathing, dressing and toileting due to limited anticipatory awareness and delayed balance reactions. He requires supervision for transfers and mobility. He is able to ambulate >150 feet in controlled environment but continues to require cues for sequencing and safety when using RW. He requires supervision with stair navigation.    Staff report from [place] Denies falls Daughter indicates that the patient has been driving The patient states he only drives to visit his wife and this is a short distance and he does not go very fast Pain Inventory Average Pain 0 Pain Right Now 0 My pain is no pain  In the last 24 hours, has pain interfered with the following? General activity 0 Relation with others 0 Enjoyment of life 0 What TIME of day is your pain at its worst? no pain Sleep (in general) Good  Pain is worse with: no pain Pain improves with: no pain Relief from Meds: no pain  Mobility walk with assistance use a cane how many minutes can you walk?  15 ability to climb steps?  yes  Function retired I need assistance with the following:  meal prep, household duties and shopping  Neuro/Psych numbness depression  Prior Studies Any changes since last visit?  no  Physicians involved in your care Any changes since last visit?  no   Family History  Problem Relation Age of Onset  . Heart disease Mother     "all her family"  . Diabetes Brother   . Hypertension Brother   . Cancer Daughter     choriocarricinoma   History   Social History  . Marital Status: Married    Spouse Name: N/A    Number of Children: 5  . Years of Education: N/A   Social History Main Topics  . Smoking status: Never Smoker   . Smokeless tobacco: Never Used  . Alcohol Use: No  . Drug Use: No  . Sexual Activity: None   Other Topics Concern  . None   Social History Narrative   Lives alone, wife in facility w/dementia   Daughter lives w/in 4 miles   Ambulates w/cane   Still drives   Past Surgical History  Procedure Laterality Date  . Knee surgery      bilateral  . Colonoscopy w/ biopsies  10/17/12    rectosigmoid=adenocarcinoma  . Eus N/A 11/07/2012    Procedure: LOWER ENDOSCOPIC ULTRASOUND (EUS);  Surgeon: Milus Banister, MD;  Location: [facility];  Service: Endoscopy;  Laterality: N/A;  . Tee without cardioversion N/A 03/18/2013    Procedure: TRANSESOPHAGEAL ECHOCARDIOGRAM (TEE);  Surgeon: Elby Showers  Aundra Dubin, MD;  Location: [facility];  Service: Cardiovascular;  Laterality: N/A;   Past Medical History  Diagnosis Date  . CAD (coronary artery disease)   . Hypertension   . Stroke   . GERD (gastroesophageal reflux disease)   . Diarrhea     intermittent past 4 years  . Colon cancer 10/17/12    adenocarcinoma  . IBS (irritable bowel syndrome)   . Hypothyroidism   . Abrasion of great toe of right foot   . Abrasion of second toe, right   . Abrasion of right lower leg   1. Adenocarcinoma of the distal rectum.  CTs of the chest, abdomen  and pelvis without evidence of metastatic disease.    EUS 11/07/2012 showed a uT3N0 circumferential nonobstructing 7 cm long adenocarcinoma in the rectum with distal edge 3-4 cm from the anal verge.    Initiation of concurrent radiation and Xeloda 11/27/2012, completed 01/06/2013  BP 139/71  Pulse 73  Resp 14  Wt 203 lb (92.08 kg)  SpO2 98%  Opioid Risk Score:   Fall Risk Score: High Fall Risk (>13 points) (educated and handout given for fall prevention in the home)  Review of Systems  Neurological: Positive for numbness.  Psychiatric/Behavioral: Positive for dysphoric mood.  All other systems reviewed and are negative.      Objective:   Physical Exam  Constitutional: He is oriented to person, place, and time.  Neurological: He is alert and oriented to person, place, and time. He has normal strength. Gait abnormal.  + Romberg Wide based Proprioception and LT is poor    Absent pinprick sensation in both feet and ankles Absent proprioception both feet and ankles No evidence of dysmetria finger nose to finger testing Skin on the feet demonstrate healed abrasions and the toes of the right foot      Assessment & Plan:  1. History of embolic infarcts multifocal with residual mild cognitive deficits and safety awareness deficits.  2. Peripheral neuropathy with decreased proprioception bilateral lower extremities. For this reason I do not think it is safe for him to drive. Discussed this with him and his daughter. He seems reluctant to give a driving, the patient's daughter is supportive of this recommendation.  Return to clinic when necessary Follow up with neurology for secondary stroke prevention as well as a discussion about his peripheral neuropathy. Etiology is unclear he denies any alcohol abuse, not a diabetic, has a history of Colo- rectal carcinoma  Over half of the 25 min visit was spent counseling and coordinating care.   Recommend no driving secondary to severe  neuropathy in both feet which can limit reaction time

## 2013-08-11 ENCOUNTER — Encounter: Payer: Self-pay | Admitting: *Deleted

## 2013-08-22 ENCOUNTER — Encounter: Payer: Self-pay | Admitting: Gastroenterology

## 2013-10-10 ENCOUNTER — Other Ambulatory Visit: Payer: Medicare Other

## 2013-10-10 ENCOUNTER — Telehealth: Payer: Self-pay | Admitting: Oncology

## 2013-10-10 ENCOUNTER — Ambulatory Visit (HOSPITAL_BASED_OUTPATIENT_CLINIC_OR_DEPARTMENT_OTHER): Payer: Medicare Other | Admitting: Oncology

## 2013-10-10 VITALS — BP 105/87 | HR 84 | Temp 98.6°F | Resp 19 | Ht 74.0 in | Wt 202.5 lb

## 2013-10-10 DIAGNOSIS — C189 Malignant neoplasm of colon, unspecified: Secondary | ICD-10-CM

## 2013-10-10 DIAGNOSIS — F3289 Other specified depressive episodes: Secondary | ICD-10-CM

## 2013-10-10 DIAGNOSIS — C20 Malignant neoplasm of rectum: Secondary | ICD-10-CM

## 2013-10-10 DIAGNOSIS — F329 Major depressive disorder, single episode, unspecified: Secondary | ICD-10-CM

## 2013-10-10 DIAGNOSIS — R197 Diarrhea, unspecified: Secondary | ICD-10-CM

## 2013-10-10 LAB — CEA: CEA: 7.9 ng/mL — ABNORMAL HIGH (ref 0.0–5.0)

## 2013-10-10 NOTE — Telephone Encounter (Signed)
Pt confirmed ov per 08/28 POF, gave pt AVS..Marland KitchenKJ

## 2013-10-10 NOTE — Progress Notes (Signed)
  [place] OFFICE PROGRESS NOTE   Diagnosis: Rectal cancer  INTERVAL HISTORY:   Alexander Johnston returns as scheduled. He reports malaise. He continues to have fecal incontinence, though he believes this is improving. He is self performing pelvic exercises. He reports a constant "bruising" of stool. No pain. He had an episode of bleeding one to 2 months ago. No consistent bleeding.  Objective:  Vital signs in last 24 hours:  Blood pressure 105/87, pulse 84, temperature 98.6 F (37 C), temperature source Oral, resp. rate 19, height 6\' 2"  (1.88 m), weight 202 lb 8 oz (91.853 kg).    HEENT: Neck without mass Lymphatics: No cervical, supraclavicular, or inguinal nodes Resp: Lungs clear bilaterally Cardio: Regular rate and rhythm GI: No hepatomegaly, nontender Vascular: Trace edema at the left greater than right ankle Rectal: Mildly diminished tone, there is a circumferential mass beginning approximately 4 cm from the anal verge. Brown stool.      Lab Results:   Lab Results  Component Value Date   CEA 5.3* 05/30/2013    Medications: I have reviewed the patient's current medications.  Assessment/Plan: 1. Adenocarcinoma of the distal rectum.  CTs of the chest, abdomen and pelvis without evidence of metastatic disease.  EUS 11/07/2012 showed a uT3N0 circumferential nonobstructing 7 cm long adenocarcinoma in the rectum with distal edge 3-4 cm from the anal verge.  Initiation of concurrent radiation and Xeloda 11/27/2012, completed 01/06/2013 2. Elevated pretreatment CEA 3. Diarrhea/rectal urgency secondary to #1. 4. History of Weight loss. 5. History of coronary artery disease. 6. Depression. 7. Multiple colon polyps on colonoscopy 10/17/2012. 8. History of Mild anemia secondary to rectal bleeding and chemotherapy 9. Fecal incontinence-most likely related to the rectal tumor 10. Acute CVA January 2015  Disposition:  Alexander Johnston continues to have fecal  incontinence. I suspect this is secondary to the rectal tumor. I recommended he see Dr. Barry Dienes to consider a palliative diverting colostomy. He declines a surgical appointment. He will discuss this further with his daughter. Alexander Johnston reports the incontinence is tolerable at present. He will return for an office visit in 3 months. His daughter will contact us if Alexander Johnston decides to proceed with a surgical evaluation. Betsy Coder, MD  10/10/2013  10:42 AM

## 2013-11-04 ENCOUNTER — Telehealth: Payer: Self-pay | Admitting: *Deleted

## 2013-11-04 NOTE — Telephone Encounter (Signed)
Received message from daughter Alexander Johnston asking for results of CEA.  Per Dr. Benay Spice; notified Alexander Johnston that CEA a little higher at 7.9 and MD not surprised; continues to recommend pt to see Dr. Barry Dienes.  Alexander Johnston verbalized understanding and expressed appreciation for call back.

## 2013-12-17 ENCOUNTER — Telehealth: Payer: Self-pay | Admitting: *Deleted

## 2013-12-17 NOTE — Telephone Encounter (Signed)
Daughter is requesting a visit with Dr. Benay Spice prior to 01/12/14. He is having rectal bleeding again-refuses to go to Dr. Deatra Ina or Dr. Barry Dienes. Feels he is weaker, stamina diminished and gait less steady. Made her aware that MD will check schedule and call back.

## 2013-12-18 ENCOUNTER — Other Ambulatory Visit: Payer: Self-pay | Admitting: *Deleted

## 2013-12-18 ENCOUNTER — Telehealth: Payer: Self-pay | Admitting: Oncology

## 2013-12-18 DIAGNOSIS — C20 Malignant neoplasm of rectum: Secondary | ICD-10-CM

## 2013-12-18 NOTE — Telephone Encounter (Signed)
per 12/18/13 pof pt daughter already aware of appt.

## 2013-12-18 NOTE — Progress Notes (Signed)
Called daughter, Anderson Malta with new appointment for 11/10 at 0930/1000. She expresses thanks for early 22.

## 2013-12-23 ENCOUNTER — Other Ambulatory Visit (HOSPITAL_BASED_OUTPATIENT_CLINIC_OR_DEPARTMENT_OTHER): Payer: Medicare Other

## 2013-12-23 ENCOUNTER — Other Ambulatory Visit: Payer: Self-pay | Admitting: Oncology

## 2013-12-23 ENCOUNTER — Telehealth: Payer: Self-pay | Admitting: Oncology

## 2013-12-23 ENCOUNTER — Ambulatory Visit (HOSPITAL_BASED_OUTPATIENT_CLINIC_OR_DEPARTMENT_OTHER): Payer: Medicare Other | Admitting: Oncology

## 2013-12-23 VITALS — BP 91/66 | HR 83 | Temp 98.6°F | Resp 18 | Ht 74.0 in | Wt 194.6 lb

## 2013-12-23 DIAGNOSIS — C20 Malignant neoplasm of rectum: Secondary | ICD-10-CM

## 2013-12-23 DIAGNOSIS — F329 Major depressive disorder, single episode, unspecified: Secondary | ICD-10-CM

## 2013-12-23 DIAGNOSIS — R159 Full incontinence of feces: Secondary | ICD-10-CM

## 2013-12-23 DIAGNOSIS — R5381 Other malaise: Secondary | ICD-10-CM

## 2013-12-23 DIAGNOSIS — R152 Fecal urgency: Secondary | ICD-10-CM

## 2013-12-23 DIAGNOSIS — R197 Diarrhea, unspecified: Secondary | ICD-10-CM

## 2013-12-23 LAB — COMPREHENSIVE METABOLIC PANEL (CC13)
ALK PHOS: 76 U/L (ref 40–150)
ALT: 14 U/L (ref 0–55)
AST: 20 U/L (ref 5–34)
Albumin: 2.9 g/dL — ABNORMAL LOW (ref 3.5–5.0)
Anion Gap: 7 mEq/L (ref 3–11)
BUN: 13.4 mg/dL (ref 7.0–26.0)
CALCIUM: 9.3 mg/dL (ref 8.4–10.4)
CHLORIDE: 104 meq/L (ref 98–109)
CO2: 25 mEq/L (ref 22–29)
CREATININE: 1 mg/dL (ref 0.7–1.3)
Glucose: 102 mg/dl (ref 70–140)
POTASSIUM: 4.3 meq/L (ref 3.5–5.1)
Sodium: 136 mEq/L (ref 136–145)
Total Bilirubin: 0.42 mg/dL (ref 0.20–1.20)
Total Protein: 6.1 g/dL — ABNORMAL LOW (ref 6.4–8.3)

## 2013-12-23 LAB — CBC WITH DIFFERENTIAL/PLATELET
BASO%: 1.2 % (ref 0.0–2.0)
Basophils Absolute: 0.1 10*3/uL (ref 0.0–0.1)
EOS%: 2.7 % (ref 0.0–7.0)
Eosinophils Absolute: 0.3 10*3/uL (ref 0.0–0.5)
HCT: 38.3 % — ABNORMAL LOW (ref 38.4–49.9)
HGB: 12.4 g/dL — ABNORMAL LOW (ref 13.0–17.1)
LYMPH%: 10.3 % — ABNORMAL LOW (ref 14.0–49.0)
MCH: 27.5 pg (ref 27.2–33.4)
MCHC: 32.5 g/dL (ref 32.0–36.0)
MCV: 84.6 fL (ref 79.3–98.0)
MONO#: 0.9 10*3/uL (ref 0.1–0.9)
MONO%: 8.6 % (ref 0.0–14.0)
NEUT#: 8 10*3/uL — ABNORMAL HIGH (ref 1.5–6.5)
NEUT%: 77.2 % — AB (ref 39.0–75.0)
Platelets: 383 10*3/uL (ref 140–400)
RBC: 4.52 10*6/uL (ref 4.20–5.82)
RDW: 15.8 % — AB (ref 11.0–14.6)
WBC: 10.3 10*3/uL (ref 4.0–10.3)
lymph#: 1.1 10*3/uL (ref 0.9–3.3)

## 2013-12-23 NOTE — Progress Notes (Signed)
  [place] OFFICE PROGRESS NOTE   Diagnosis: rectal cancer  INTERVAL HISTORY:   Mr. Ehmke returns prior to a scheduled visit.he reports less frequent bowel movements. He has intermittent rectal bleeding. No pain. He complains of malaise and somnolence. Decreased stamina. He reports eating less food in order to decrease stool frequency.he continues to have episodes of incontinence.  Objective:  Vital signs in last 24 hours:  Blood pressure 91/66, pulse 83, temperature 98.6 F (37 C), temperature source Oral, resp. rate 18, height 6\' 2"  (1.88 m), weight 194 lb 9.6 oz (88.27 kg).    HEENT: neck without mass Lymphatics: no cervical, supra-clavicular, axillary, or inguinal nodes Resp: lungs clear bilaterally Cardio: regular rate and rhythm GI: hepatosplenomegaly, nontender, no mass Vascular: no leg edema Rectal: There is a circumferential mass 4-5 cm from anal verge with narrowing of the rectal lumen.    Lab Results:  Lab Results  Component Value Date   WBC 10.3 12/23/2013   HGB 12.4* 12/23/2013   HCT 38.3* 12/23/2013   MCV 84.6 12/23/2013   PLT 383 12/23/2013   NEUTROABS 8.0* 12/23/2013    Lab Results  Component Value Date   CEA 7.9* 10/10/2013     Medications: I have reviewed the patient's current medications.  Assessment/Plan: 1. Adenocarcinoma of the distal rectum.  CTs of the chest, abdomen and pelvis without evidence of metastatic disease.   EUS 11/07/2012 showed a uT3N0 circumferential nonobstructing 7 cm long adenocarcinoma in the rectum with distal edge 3-4 cm from the anal verge.   Initiation of concurrent radiation and Xeloda 11/27/2012, completed 01/06/2013 2. Elevated pretreatment CEA 3. Diarrhea/rectal urgency secondary to #1. 4. History of Weight loss. 5. History of coronary artery disease. 6. Depression. 7. Multiple colon polyps on colonoscopy 10/17/2012. 8. History of Mild anemia secondary to rectal bleeding and  chemotherapy 9. Fecal incontinence-most likely related to the rectal tumor 10. Acute CVA January 2015   Disposition:  Mr. Tavano appears unchanged. The malaise does not appear to be related to anemia. It is possible the malaise is secondary to rectal cancer or another etiology. He is on multiple antihypertensive agents and the blood pressure is relatively low today. He may feel better if the antihypertensive regimen is decreased. He will discuss this with Dr. Fredderick Phenix.  Mr. Nhan declines a referral to Dr. Barry Dienes to consider palliative surgery. He agrees to a 4 month follow-up visit.  Betsy Coder, MD  12/23/2013  10:32 AM

## 2013-12-23 NOTE — Telephone Encounter (Signed)
gv adn printed appt sched and avs for pt for March 2016 °

## 2013-12-24 ENCOUNTER — Telehealth: Payer: Self-pay | Admitting: *Deleted

## 2013-12-24 DIAGNOSIS — C20 Malignant neoplasm of rectum: Secondary | ICD-10-CM

## 2013-12-24 LAB — CEA: CEA: 13.1 ng/mL — ABNORMAL HIGH (ref 0.0–5.0)

## 2013-12-24 NOTE — Telephone Encounter (Signed)
-----   Message from [name], MD sent at 12/24/2013  1:19 PM EST ----- Please call patient, cea is higher

## 2013-12-24 NOTE — Telephone Encounter (Signed)
Spoke with pt's daughter, CEA results given. Daughter requested to speak with nutritionist. Will place referral.

## 2014-01-05 ENCOUNTER — Telehealth: Payer: Self-pay | Admitting: Oncology

## 2014-01-05 NOTE — Telephone Encounter (Signed)
Spk/pt's daughter, she states pt's wife is passing and could we please call back in a couple of weeks. I advised we would do that and sorry for what pt is going through. I put referral incomplete and copied referral to call pt back in a couple of weeks......Marland Kitchen KJ

## 2014-01-09 ENCOUNTER — Encounter: Payer: Medicare Other | Admitting: Nutrition

## 2014-01-12 ENCOUNTER — Ambulatory Visit: Payer: Medicare Other | Admitting: Oncology

## 2014-04-10 ENCOUNTER — Telehealth: Payer: Self-pay | Admitting: Oncology

## 2014-04-10 NOTE — Telephone Encounter (Signed)
S/w pt's significate other and she advised pt needed to come in sooner than end of March, she confirmed 03/11 MD visit and she also states pt denies needing Nutrictionist. KJ

## 2014-04-22 ENCOUNTER — Telehealth: Payer: Self-pay | Admitting: *Deleted

## 2014-04-22 NOTE — Telephone Encounter (Signed)
Patient family member called to cancel appointment on 04/24/14. Patient family member states that he is physically exhausted and does not want to continue with any further treatment. They are contemplating hospice at the moment. RN to notifiy MD and nurse.

## 2014-04-23 ENCOUNTER — Telehealth: Payer: Self-pay | Admitting: *Deleted

## 2014-04-23 NOTE — Telephone Encounter (Addendum)
Spoke with pt's daughter, hospice is seeing pt every other week. Appointments exhaust patient. Denies any needs at this time. She thanks Dr. Benay Spice for his care. Understands to call office as needed.

## 2014-04-24 ENCOUNTER — Ambulatory Visit: Payer: Medicare Other | Admitting: Oncology

## 2014-05-12 ENCOUNTER — Ambulatory Visit: Payer: Medicare Other | Admitting: Oncology

## 2014-05-17 ENCOUNTER — Encounter: Payer: Self-pay | Admitting: Gastroenterology

## 2014-10-15 DEATH — deceased

## 2015-08-27 IMAGING — CT CT CHEST W/ CM
2 of 5 series · 15 of 36 positions shown, 18 images · IV contrast (omnipaque)
Comparison: None.

CLINICAL DATA: NEWLY DIAGNOSED RECTAL MASS. RECTOSIGMOID CARCINOMA.

EXAM:
CT CHEST, ABDOMEN, AND PELVIS WITH CONTRAST
TECHNIQUE: Multidetector CT imaging of the chest, abdomen and pelvis was
performed following the standard protocol during bolus
administration of intravenous contrast.
CONTRAST:  100mL OMNIPAQUE IOHEXOL 300 MG/ML  SOLN

[Series 2: cap with · axial · 0.75mm/px · z∈[-628,-68]mm · 12 of 130 slices shown, 15 images]
[im 9/130  mediastinal]
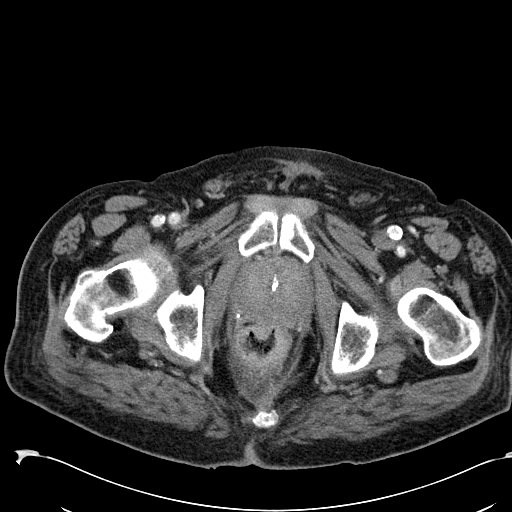
[im 9/130  lung]
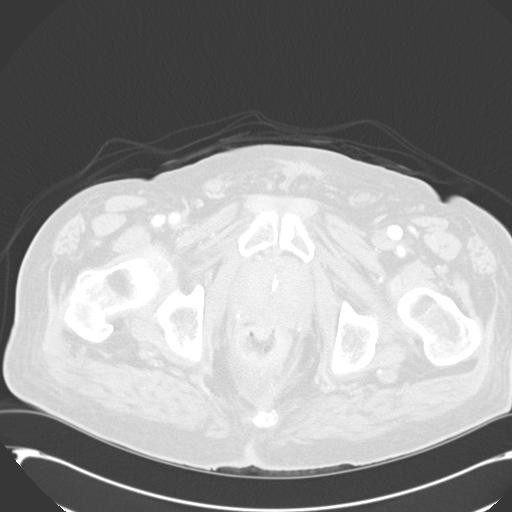
[im 17/130  lung]
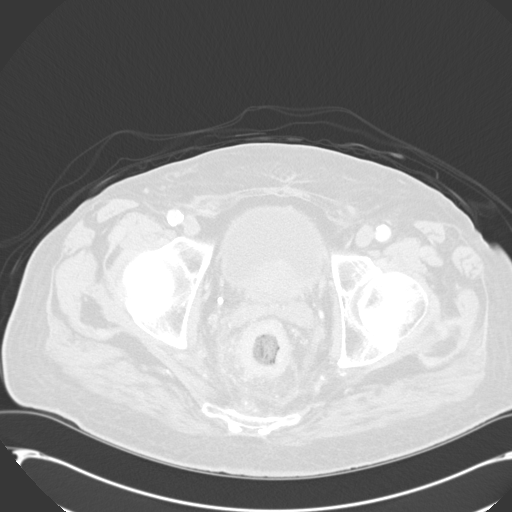
[im 33/130  lung]
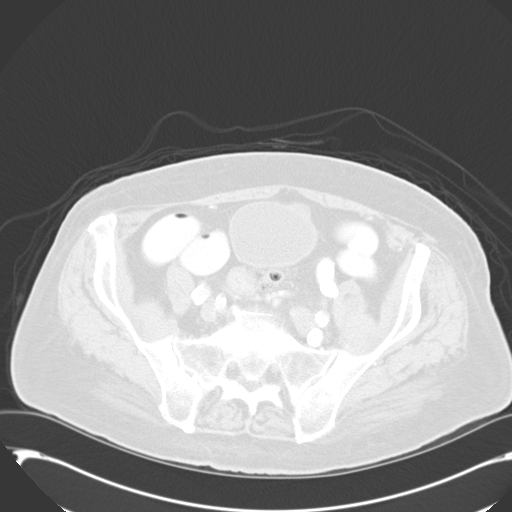
[im 41/130  lung]
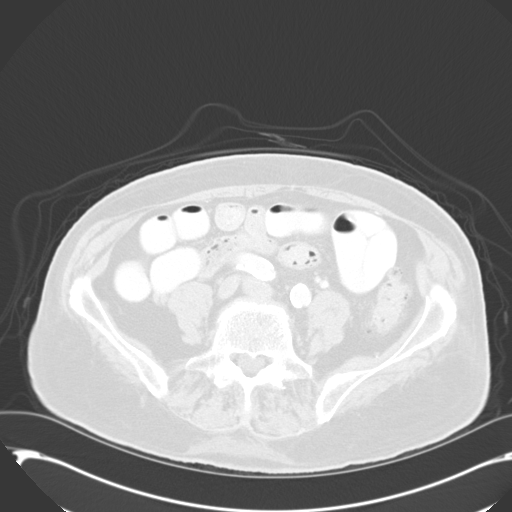
[im 49/130  mediastinal]
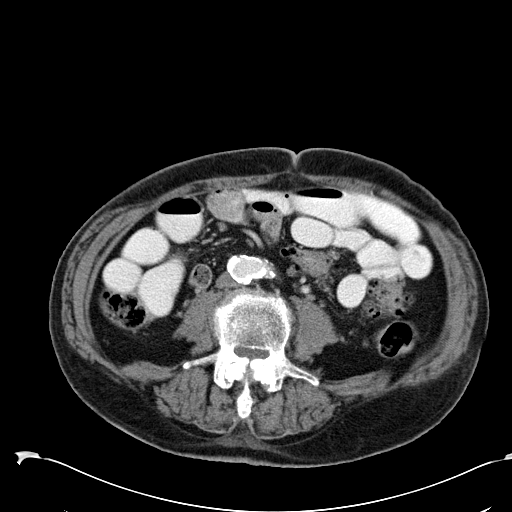
[im 49/130  lung]
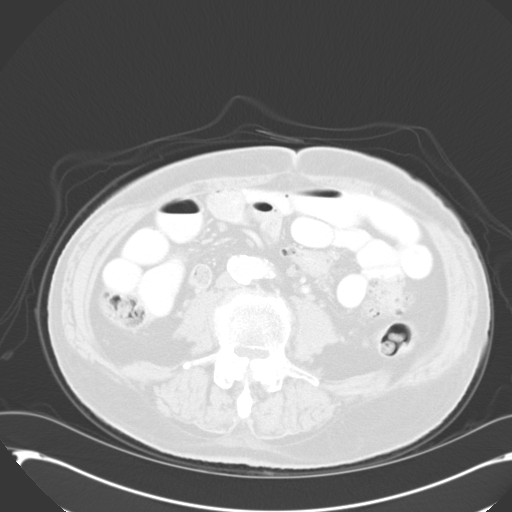
[im 57/130  lung]
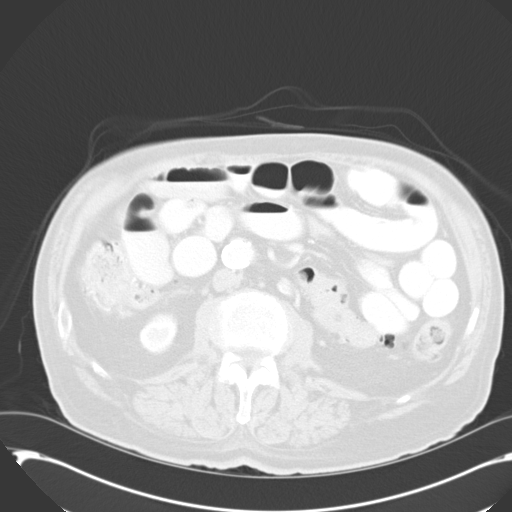
[im 73/130  lung]
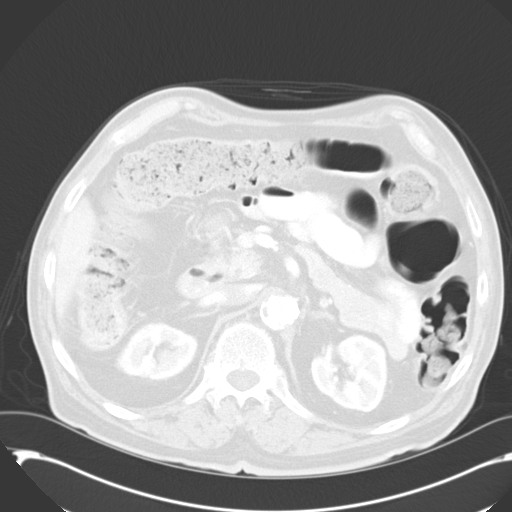
[im 81/130  lung]
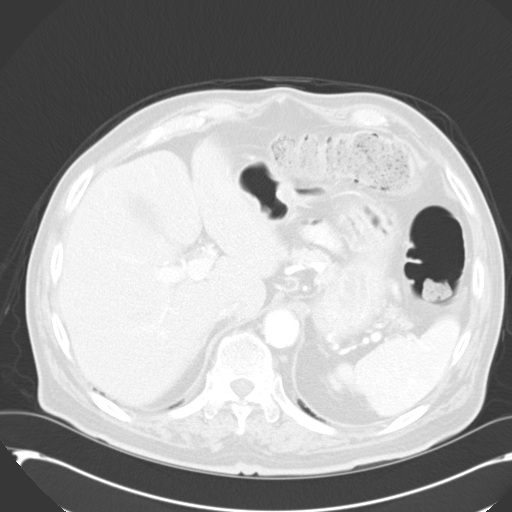
[im 89/130  mediastinal]
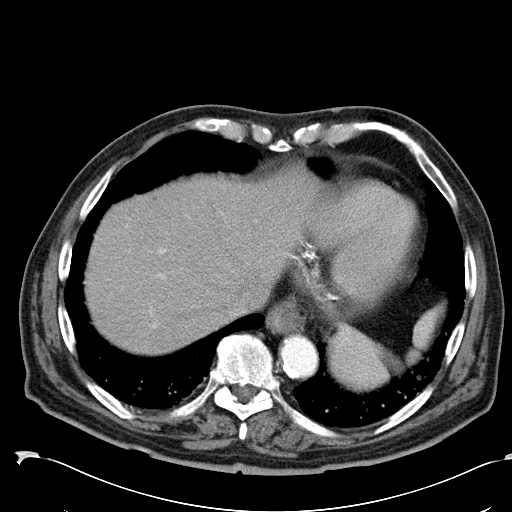
[im 89/130  lung]
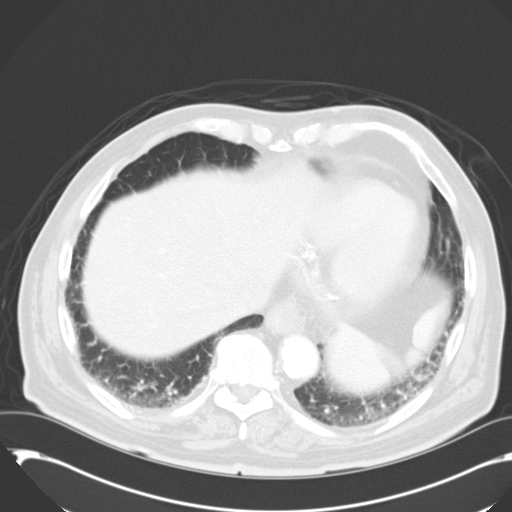
[im 97/130  lung]
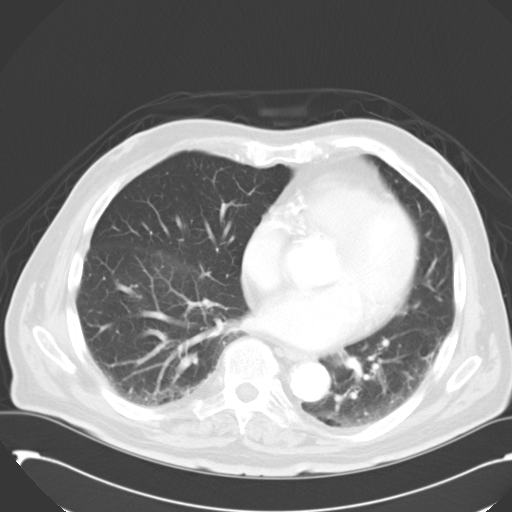
[im 113/130  lung]
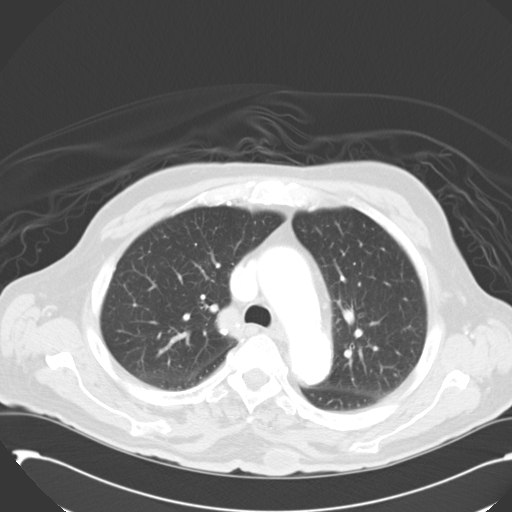
[im 121/130  lung]
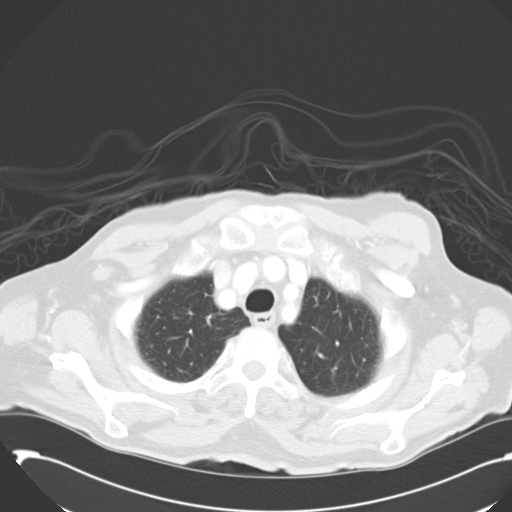

[Series 602: cor · coronal · 1.31mm/px · 3 of 125 slices shown]
[im 25/125  lung]
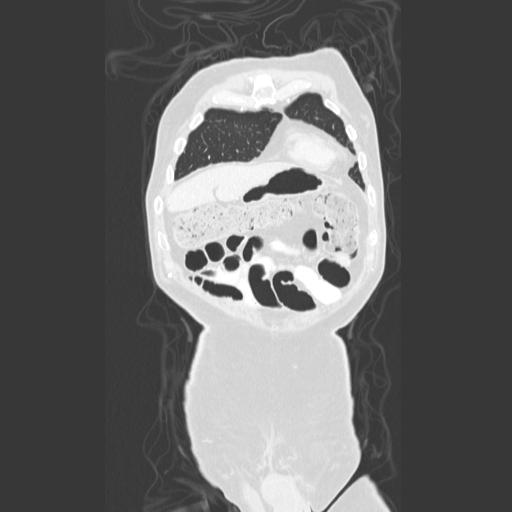
[im 50/125  lung]
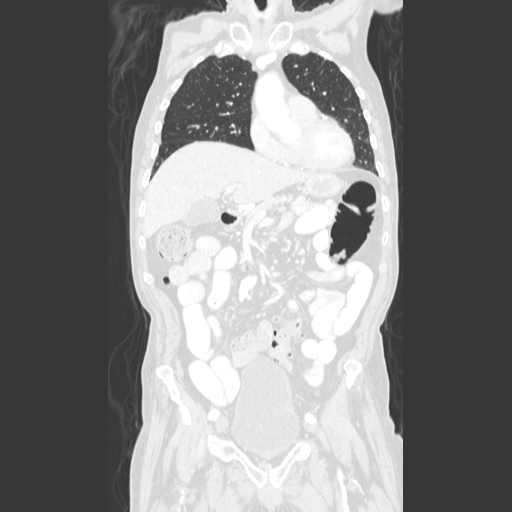
[im 75/125  lung]
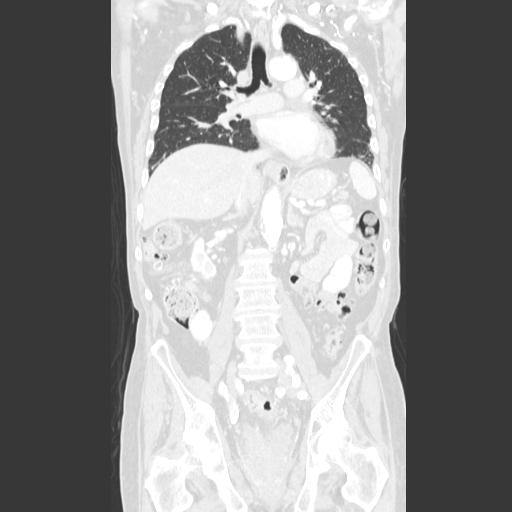

[15 of 36 positions shown; findings below may reference images not displayed]

FINDINGS: CT CHEST FINDINGS

No evidence of mediastinal or hilar masses. No adenopathy seen
elsewhere within the thorax. A small hiatal hernia is noted.

No evidence of pleural or pericardial effusion. No suspicious
pulmonary nodules or masses are identified. No evidence of pulmonary
infiltrate or central endobronchial obstruction. No evidence of
chest wall mass or suspicious bone lesions.

CT ABDOMEN AND PELVIS FINDINGS

Small cyst noted in the posterior right hepatic lobe, however no
liver masses are identified. The gallbladder, pancreas, spleen, and
adrenal glands are normal in appearance. Small benign appearing
renal cysts are noted bilaterally however there is no evidence of
renal masses or hydronephrosis.

Diffuse rectal wall thickening is seen, consistent with known rectal
carcinoma. There is mild soft tissue stranding in the perirectal
fat, however no pathologically enlarged lymph nodes are seen. Mildly
enlarged prostate gland noted. Mild diffuse bladder wall thickening,
consistent with chronic bladder outlet obstruction. No evidence of
pelvic or abdominal lymphadenopathy. No other soft tissue masses
identified.

Large stool burden noted extensive colonic diverticulosis noted,
however there is no evidence of diverticulitis. No other
inflammatory process or abnormal fluid collections are identified.
No suspicious bone lesions identified. Lumbar spine degenerative
changes are noted as well as a mild superior endplate compression
fracture of the L1 vertebral body which appears old.
IMPRESSION: CT CHEST IMPRESSION

No evidence of thoracic metastatic disease or other acute findings.

Small hiatal hernia.

CT ABDOMEN AND PELVIS IMPRESSION

Mild concentric rectal wall thickening, consistent with known rectal
carcinoma. No evidence of nodal or distant metastatic disease.

Large colonic stool burden and extensive diverticulosis. No evidence
of diverticulitis.

Mildly enlarged prostate and chronic bladder outlet obstruction.
